# Patient Record
Sex: Male | Born: 1965 | Race: Black or African American | Hispanic: No | Marital: Single | Smoking: Never smoker
Health system: Southern US, Community
[De-identification: ages and names within clinical notes are randomized; demographics above are authoritative.]

## PROBLEM LIST (undated history)

## (undated) DIAGNOSIS — L0390 Cellulitis, unspecified: Secondary | ICD-10-CM

## (undated) HISTORY — PX: OTHER SURGICAL HISTORY: SHX169

---

## 1998-08-03 ENCOUNTER — Ambulatory Visit: Admission: RE | Admit: 1998-08-03 | Discharge: 1998-08-03 | Payer: Self-pay | Admitting: *Deleted

## 2001-08-26 ENCOUNTER — Emergency Department (HOSPITAL_COMMUNITY): Admission: EM | Admit: 2001-08-26 | Discharge: 2001-08-27 | Payer: Self-pay | Admitting: Emergency Medicine

## 2001-12-03 ENCOUNTER — Emergency Department (HOSPITAL_COMMUNITY): Admission: EM | Admit: 2001-12-03 | Discharge: 2001-12-03 | Payer: Self-pay

## 2001-12-10 ENCOUNTER — Ambulatory Visit (HOSPITAL_BASED_OUTPATIENT_CLINIC_OR_DEPARTMENT_OTHER): Admission: RE | Admit: 2001-12-10 | Discharge: 2001-12-10 | Payer: Self-pay | Admitting: Urology

## 2003-10-16 ENCOUNTER — Encounter: Admission: RE | Admit: 2003-10-16 | Discharge: 2003-10-16 | Payer: Self-pay

## 2004-09-22 HISTORY — PX: SHOULDER SURGERY: SHX246

## 2008-02-26 ENCOUNTER — Emergency Department (HOSPITAL_BASED_OUTPATIENT_CLINIC_OR_DEPARTMENT_OTHER): Admission: EM | Admit: 2008-02-26 | Discharge: 2008-02-27 | Payer: Self-pay | Admitting: Emergency Medicine

## 2008-02-28 ENCOUNTER — Emergency Department (HOSPITAL_BASED_OUTPATIENT_CLINIC_OR_DEPARTMENT_OTHER): Admission: EM | Admit: 2008-02-28 | Discharge: 2008-02-28 | Payer: Self-pay | Admitting: Emergency Medicine

## 2008-03-04 ENCOUNTER — Emergency Department (HOSPITAL_BASED_OUTPATIENT_CLINIC_OR_DEPARTMENT_OTHER): Admission: EM | Admit: 2008-03-04 | Discharge: 2008-03-04 | Payer: Self-pay | Admitting: Emergency Medicine

## 2008-05-10 ENCOUNTER — Emergency Department (HOSPITAL_COMMUNITY): Admission: EM | Admit: 2008-05-10 | Discharge: 2008-05-10 | Payer: Self-pay | Admitting: Family Medicine

## 2010-10-13 ENCOUNTER — Encounter: Payer: Self-pay | Admitting: Sports Medicine

## 2014-06-06 ENCOUNTER — Emergency Department (HOSPITAL_COMMUNITY)
Admission: EM | Admit: 2014-06-06 | Discharge: 2014-06-06 | Discharge status: Against Medical Advice | Payer: Self-pay | Attending: Emergency Medicine | Admitting: Emergency Medicine

## 2014-06-06 ENCOUNTER — Encounter (HOSPITAL_COMMUNITY): Payer: Self-pay | Admitting: Emergency Medicine

## 2014-06-06 ENCOUNTER — Emergency Department (HOSPITAL_COMMUNITY)
Admission: EM | Admit: 2014-06-06 | Discharge: 2014-06-07 | Disposition: A | Discharge status: Home / Self Care | Payer: Self-pay | Attending: Emergency Medicine | Admitting: Emergency Medicine

## 2014-06-06 DIAGNOSIS — M79605 Pain in left leg: Secondary | ICD-10-CM

## 2014-06-06 DIAGNOSIS — Z5320 Procedure and treatment not carried out because of patient's decision for unspecified reasons: Secondary | ICD-10-CM | POA: Insufficient documentation

## 2014-06-06 DIAGNOSIS — R509 Fever, unspecified: Secondary | ICD-10-CM | POA: Insufficient documentation

## 2014-06-06 DIAGNOSIS — Z79899 Other long term (current) drug therapy: Secondary | ICD-10-CM | POA: Insufficient documentation

## 2014-06-06 DIAGNOSIS — D72829 Elevated white blood cell count, unspecified: Secondary | ICD-10-CM | POA: Insufficient documentation

## 2014-06-06 DIAGNOSIS — L03119 Cellulitis of unspecified part of limb: Principal | ICD-10-CM

## 2014-06-06 DIAGNOSIS — M79609 Pain in unspecified limb: Secondary | ICD-10-CM | POA: Insufficient documentation

## 2014-06-06 DIAGNOSIS — M7989 Other specified soft tissue disorders: Secondary | ICD-10-CM | POA: Insufficient documentation

## 2014-06-06 DIAGNOSIS — L03116 Cellulitis of left lower limb: Secondary | ICD-10-CM

## 2014-06-06 DIAGNOSIS — L02419 Cutaneous abscess of limb, unspecified: Secondary | ICD-10-CM | POA: Insufficient documentation

## 2014-06-06 NOTE — ED Notes (Signed)
PT states that he had a fever, chills and general malaise over the weekend; pt states that he woke up Monday morning with redness and swelling to left lower leg; pt states that he was seen at the urgent care yesterday and was prescribed Potassium and advised if redness and swelling persisted to follow up with ER; pt states that the redness and swelling has gotten worse.

## 2014-06-06 NOTE — ED Notes (Signed)
Pt called twice for triage no answer in lobby.

## 2014-06-07 LAB — BASIC METABOLIC PANEL
ANION GAP: 17 — AB (ref 5–15)
BUN: 16 mg/dL (ref 6–23)
CHLORIDE: 100 meq/L (ref 96–112)
CO2: 23 meq/L (ref 19–32)
Calcium: 10.1 mg/dL (ref 8.4–10.5)
Creatinine, Ser: 0.92 mg/dL (ref 0.50–1.35)
GFR calc non Af Amer: >90 mL/min (ref >90)
Glucose, Bld: 70 mg/dL (ref 70–99)
POTASSIUM: 4.1 meq/L (ref 3.7–5.3)
SODIUM: 140 meq/L (ref 137–147)

## 2014-06-07 LAB — CBC WITH DIFFERENTIAL/PLATELET
BASOS ABS: 0 10*3/uL (ref 0.0–0.1)
Basophils Relative: 0 % (ref 0–1)
Eosinophils Absolute: 0.3 10*3/uL (ref 0.0–0.7)
Eosinophils Relative: 2 % (ref 0–5)
HEMATOCRIT: 40.5 % (ref 39.0–52.0)
Hemoglobin: 14.1 g/dL (ref 13.0–17.0)
LYMPHS PCT: 10 % — AB (ref 12–46)
Lymphs Abs: 1.8 10*3/uL (ref 0.7–4.0)
MCH: 30.3 pg (ref 26.0–34.0)
MCHC: 34.8 g/dL (ref 30.0–36.0)
MCV: 87.1 fL (ref 78.0–100.0)
MONO ABS: 1.4 10*3/uL — AB (ref 0.1–1.0)
Monocytes Relative: 8 % (ref 3–12)
NEUTROS ABS: 14.1 10*3/uL — AB (ref 1.7–7.7)
NEUTROS PCT: 80 % — AB (ref 43–77)
PLATELETS: 224 10*3/uL (ref 150–400)
RBC: 4.65 MIL/uL (ref 4.22–5.81)
RDW: 13.7 % (ref 11.5–15.5)
WBC: 17.6 10*3/uL — AB (ref 4.0–10.5)

## 2014-06-07 MED ORDER — OXYCODONE-ACETAMINOPHEN 5-325 MG PO TABS: 1.0000 | ORAL_TABLET | Freq: Four times a day (QID) | ORAL | Status: DC | PRN

## 2014-06-07 MED ORDER — NAPROXEN 500 MG PO TABS: 500.0000 mg | ORAL_TABLET | Freq: Two times a day (BID) | ORAL | Status: DC | PRN

## 2014-06-07 MED ORDER — CEPHALEXIN 500 MG PO CAPS: ORAL_CAPSULE | ORAL | Status: DC

## 2014-06-07 MED ORDER — SULFAMETHOXAZOLE-TMP DS 800-160 MG PO TABS: 1.0000 | ORAL_TABLET | Freq: Two times a day (BID) | ORAL | Status: DC

## 2014-06-07 MED ORDER — MORPHINE SULFATE 4 MG/ML IJ SOLN: 4.0000 mg | Freq: Once | INTRAMUSCULAR | Status: DC

## 2014-06-07 MED ORDER — SODIUM CHLORIDE 0.9 % IV BOLUS (SEPSIS)
1000.0000 mL | Freq: Once | INTRAVENOUS | Status: AC
  Administered 2014-06-07: 1000 mL via INTRAVENOUS

## 2014-06-07 MED ORDER — PIPERACILLIN-TAZOBACTAM 3.375 G IVPB 30 MIN
3.3750 g | Freq: Once | INTRAVENOUS | Status: AC
  Administered 2014-06-07: 3.375 g via INTRAVENOUS
  Filled 2014-06-07: qty 50

## 2014-06-07 MED ORDER — LORAZEPAM 1 MG PO TABS
1.0000 mg | ORAL_TABLET | Freq: Once | ORAL | Status: AC
  Administered 2014-06-07: 1 mg via ORAL
  Filled 2014-06-07: qty 1

## 2014-06-07 MED ORDER — MORPHINE SULFATE 4 MG/ML IJ SOLN
4.0000 mg | Freq: Once | INTRAMUSCULAR | Status: AC
  Administered 2014-06-07: 4 mg via INTRAVENOUS
  Filled 2014-06-07: qty 1

## 2014-06-07 NOTE — ED Notes (Signed)
Pt given Morphine  pt informed he needs to find a ride or stay until 0541. Pt states he will stay. MD aware.

## 2014-06-07 NOTE — ED Notes (Signed)
Morphine infiltrated in Pt IV. Pt c/o severe burning at IV site. Given ice pack.

## 2014-06-07 NOTE — ED Provider Notes (Signed)
CSN: [id number]     Arrival date & time 06/06/14  2000 History   First MD Initiated Contact with Patient 06/06/14 2259     Chief Complaint  Patient presents with  . Cellulitis     (Consider location/radiation/quality/duration/timing/severity/associated sxs/prior Treatment) HPI Comments: Edward Zuniga is a 48 y.o. male with no significant PMHx who presents to the ED with 3 days of LLE pain/swelling/redness which is worsening. Pt states that on Saturday he went to urgent care for fever of 101.7 and some mild n/v/d, given tylenol and phenergan with relief, and sent home. At that time he states he had no rashes or leg symptoms. Sunday he felt better, but by Monday his L lower leg began to swell, became red and hot and very tender, although his fever had resolved. He went back to urgent care who advised him to start taking potassium tabs and return for any worsening of redness/swelling. Tonight he states the redness, swelling, pain, and warmth has begun to spread into the area above his knee and up towards his groin. Pain is 10/10, tightness and burning, nonradiating, constant, worse with activity/standing/walking, and improved with elevation and rest. Reports that he's felt febrile and had chills throughout the day today which prompted him to come to the ED. 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 Denies ongoing fever at this time, CP, SOB, cough, n/v/d/c, dysuria, hematuria, hemoptysis, PND, orthopnea, weakness, numbness, paresthesias, hx of DVT/PE, FHx of DVT/PE, recent travel or immobilization, recent surgeries, or any known tick bites. Denies red streaking or weeping of the leg.   Patient is a 48 y.o. male presenting with leg pain. The history is provided by the patient. No language interpreter was used.  Leg Pain Location:  Leg Time since incident:  3 days Injury: no   Leg location:  L lower leg Pain details:    Quality:  Pressure and burning   Radiates to:   Does not radiate   Severity:  Severe (10/10)   Onset quality:  Gradual   Duration:  3 days   Timing:  Constant   Progression:  Worsening Chronicity:  New Prior injury to area:  No Relieved by:  Elevation and rest Worsened by:  Activity Ineffective treatments:  None tried Associated symptoms: fever (101.7 saturday, intermittent since) and swelling   Associated symptoms: no back pain, no decreased ROM, no itching, no muscle weakness, no numbness and no tingling   Risk factors: obesity     History reviewed. No pertinent past medical history. History reviewed. No pertinent past surgical history. No family history on file. History  Substance Use Topics  . Smoking status: Never Smoker   . Smokeless tobacco: Not on file  . Alcohol Use: No     Comment: occ    Review of Systems  Constitutional: Positive for fever (101.7 saturday, intermittent since) and chills. Negative for diaphoresis.  HENT: Negative for congestion, sinus pressure and sore throat.   Respiratory: Negative for cough and shortness of breath.   Cardiovascular: Positive for leg swelling. Negative for chest pain and palpitations.  Gastrointestinal: Negative for nausea, vomiting, abdominal pain, diarrhea and abdominal distention.  Genitourinary: Negative for dysuria, urgency, frequency, hematuria, flank pain, discharge, penile swelling, scrotal swelling, penile pain and testicular pain.  Musculoskeletal: Positive for myalgias (LLE pain). Negative for arthralgias, back pain and joint swelling.  Skin: Positive for color change. Negative for itching.  Allergic/Immunologic: Negative for immunocompromised state.  Neurological: Negative for dizziness, weakness,  light-headedness, numbness and headaches.  Hematological: Negative for adenopathy.  10 Systems reviewed and are negative for acute change except as noted in the HPI.     Allergies  Review of patient's allergies indicates no known allergies.  Home Medications    Prior to Admission medications   Medication Sig Start Date End Date Taking? Authorizing Provider  ibuprofen (ADVIL,MOTRIN) 800 MG tablet Take 800 mg by mouth 2 (two) times daily as needed for moderate pain.   Yes Historical Provider, MD  Multiple Vitamin (MULTIVITAMIN WITH MINERALS) TABS tablet Take 1 tablet by mouth daily.   Yes Historical Provider, MD  potassium chloride SA (K-DUR,KLOR-CON) 20 MEQ tablet Take 20 mEq by mouth daily.   Yes Historical Provider, MD  promethazine (PHENERGAN) 25 MG tablet Take 25 mg by mouth every 6 (six) hours as needed for nausea or vomiting.   Yes Historical Provider, MD  cephALEXin (KEFLEX) 500 MG capsule 2 caps po bid x 7 days 06/07/14   Donnita Falls Camprubi-Soms, PA-C  naproxen (NAPROSYN) 500 MG tablet Take 1 tablet (500 mg total) by mouth 2 (two) times daily as needed for mild pain, moderate pain or headache (TAKE WITH MEALS.). 06/07/14   Bryce Cheever Strupp Camprubi-Soms, PA-C  oxyCODONE-acetaminophen (PERCOCET) 5-325 MG per tablet Take 1 tablet by mouth every 6 (six) hours as needed for severe pain. 06/07/14   Denese Mentink Strupp Camprubi-Soms, PA-C  sulfamethoxazole-trimethoprim (BACTRIM DS) 800-160 MG per tablet Take 1 tablet by mouth 2 (two) times daily. 06/07/14   Keelyn Monjaras Strupp Camprubi-Soms, PA-C   BP 137/79  Pulse 100  Temp(Src) 98 F (36.7 C) (Oral)  Resp 18  Ht 6' (1.829 m)  Wt 280 lb (127.007 kg)  BMI 37.97 kg/m2  SpO2 99% Physical Exam  Nursing note and vitals reviewed. Constitutional: He is oriented to person, place, and time. Vital signs are normal. He appears well-developed and well-nourished.  Non-toxic appearance. No distress.  Afebrile, nontoxic, NAD  HENT:  Head: Normocephalic and atraumatic.  Mouth/Throat: Oropharynx is clear and moist and mucous membranes are normal.  Eyes: Conjunctivae and EOM are normal. Right eye exhibits no discharge. Left eye exhibits no discharge.  Neck: Normal range of motion. Neck supple.  Cardiovascular:  Normal rate, regular rhythm, normal heart sounds and intact distal pulses.  Exam reveals no gallop and no friction rub.   No murmur heard. Distal pulses intact, good cap refill in all digits  Pulmonary/Chest: Effort normal and breath sounds normal. No respiratory distress. He has no decreased breath sounds. He has no wheezes. He has no rhonchi. He has no rales.  Abdominal: Soft. Normal appearance and bowel sounds are normal. He exhibits no distension. There is no tenderness. There is no rigidity, no rebound and no guarding.  Musculoskeletal: Normal range of motion.       Left lower leg: He exhibits tenderness and swelling.  LLE with 2+ edema, erythema, warmth, and tenderness extending from ankle to knee and some erythema over medial thigh up into inguinal area. Neg Homan's. No superficial veins noted. No popliteal masses or TTP. Strength 5/5 in all extremities, sensation grossly intact, distal pulses intact. FROM intact in LLE   Lymphadenopathy:       Left: Inguinal adenopathy present.  L inguinal LAD which is nontender  Neurological: He is alert and oriented to person, place, and time. He has normal strength. No sensory deficit.  Skin: Skin is warm and dry. There is erythema.  LLE with swelling, erythema, warmth, and TTP over entire lower leg  from ankle to knee, and redness extending medially into thigh and inguinal area. 2+ edema. No obvious skin injury although multiple insect bites noted to ankle. No weeping or abscessed areas.  Psychiatric: He has a normal mood and affect.          ED Course  Procedures (including critical care time) Labs Review Labs Reviewed  CBC WITH DIFFERENTIAL - Abnormal; Notable for the following:    WBC 17.6 (*)    Neutrophils Relative % 80 (*)    Neutro Abs 14.1 (*)    Lymphocytes Relative 10 (*)    Monocytes Absolute 1.4 (*)    All other components within normal limits  BASIC METABOLIC PANEL - Abnormal; Notable for the following:    Anion gap 17 (*)     All other components within normal limits    Imaging Review No results found.   EKG Interpretation None      MDM   Final diagnoses:  Left leg cellulitis  Leukocytosis  Left leg swelling  Left leg pain     48y/o male with LLE cellulitis vs DVT. Given lack of risk factors for DVT, this is likely cellulitis, especially since pt endorses documented fever x3 days. Will obtain basic labs in order to have baseline, and give dose of IV zosyn here. Pt VSS and afebrile at the moment, doubt need for admission, especially since he has no comorbidities and has not been trialed on PO outpt therapy. Will give morphine now for pain. Will give 1 dose of zosyn prior to d/c and plan for d/c on bactrim/keflex  1:45 AM CBC w/diff showing WBC 17.6, BMP WNL. Nursing stating they lost IV access, and pt is severely afraid of needles. Will give ativan PO and replace IV.  2:30 AM Repeat VS showing temp 99.5, and HR now 108, will give fluids while zosyn is given.  2:51 AM Pt continuing to have pain, will redose morphine . Fluids and zosyn running, will discharge after finished. Discussed having him return in 2 days to have leg rechecked, and given strict return precautions especially with regards to possibility of DVT and that if leg is not improving in 2 days he may need an ultrasound to eval for clot. Highly doubt that he needs emergent U/S or empiric anticoagulation at this time. Will give naprosyn/percocet for pain, and bactrim/keflex for infection. Resource guide given. I explained the diagnosis and have given explicit precautions to return to the ER including for any other new or worsening symptoms. The patient understands and accepts the medical plan as it's been dictated and I have answered their questions. Discharge instructions concerning home care and prescriptions have been given. The patient is STABLE and is discharged to home in good condition.  BP 151/85  Pulse 108  Temp(Src) 99.5 F (37.5  C) (Oral)  Resp 14  Ht 6' (1.829 m)  Wt 280 lb (127.007 kg)  BMI 37.97 kg/m2  SpO2 98%  Meds ordered this encounter  Medications  . morphine 4 MG/ML injection 4 mg    Sig:   . piperacillin-tazobactam (ZOSYN) IVPB 3.375 g    Sig:     Order Specific Question:  Antibiotic Indication:    Answer:  Cellulitis  . LORazepam (ATIVAN) tablet 1 mg    Sig:   . sodium chloride 0.9 % bolus 1,000 mL    Sig:   . morphine 4 MG/ML injection 4 mg    Sig:   . oxyCODONE-acetaminophen (PERCOCET) 5-325 MG per tablet  Sig: Take 1 tablet by mouth every 6 (six) hours as needed for severe pain.    Dispense:  10 tablet    Refill:  0    Order Specific Question:  Supervising Provider    Answer:  Eber Hong D [[id number]]  . naproxen (NAPROSYN) 500 MG tablet    Sig: Take 1 tablet (500 mg total) by mouth 2 (two) times daily as needed for mild pain, moderate pain or headache (TAKE WITH MEALS.).    Dispense:  20 tablet    Refill:  0    Order Specific Question:  Supervising Provider    Answer:  Eber Hong D [[id number]]  . sulfamethoxazole-trimethoprim (BACTRIM DS) 800-160 MG per tablet    Sig: Take 1 tablet by mouth 2 (two) times daily.    Dispense:  14 tablet    Refill:  0    Order Specific Question:  Supervising Provider    Answer:  Eber Hong D [[id number]]  . cephALEXin (KEFLEX) 500 MG capsule    Sig: 2 caps po bid x 7 days    Dispense:  28 capsule    Refill:  0    Order Specific Question:  Supervising Provider    Answer:  Eber Hong D [[id number]]     Donnita Falls Camprubi-Soms, PA-C 06/07/14 0505

## 2014-06-07 NOTE — Discharge Instructions (Signed)
Keep your leg clean and dry. Take antibiotics until it is finished. Take naprosyn and percocet as directed, as needed for pain but do not drive or operate machinery with pain medication use. Use tylenol or motrin for fever, unless you've been using the naprosyn and percocet which will also help with fever. Followup with [facility]Primary Care doctor in 2 days for wound recheck.  Return to emergency department for emergent changing or worsening symptoms such as increasing fever resistant to tylenol/motrin, worsening redness or swelling or pain, chest pain or shortness of breath, or any other new symptoms.   Cellulitis Cellulitis is an infection of the skin and the tissue under the skin. The infected area is usually red and tender. This happens most often in the arms and lower legs. HOME CARE   Take your antibiotic medicine as told. Finish the medicine even if you start to feel better.  Keep the infected arm or leg raised (elevated).  Put a warm cloth on the area up to 4 times per day.  Only take medicines as told by your doctor.  Keep all doctor visits as told. GET HELP IF:  You see red streaks on the skin coming from the infected area.  Your red area gets bigger or turns a dark color.  Your bone or joint under the infected area is painful after the skin heals.  Your infection comes back in the same area or different area.  You have a puffy (swollen) bump in the infected area.  You have new symptoms.  You have a fever. GET HELP RIGHT AWAY IF:   You feel very sleepy.  You throw up (vomit) or have watery poop (diarrhea).  You feel sick and have muscle aches and pains. MAKE SURE YOU:   Understand these instructions.  Will watch your condition.  Will get help right away if you are not doing well or get worse. Document Released: 02/25/2008 Document Revised: 01/23/2014 Document Reviewed: 11/24/2011 [facility] Patient Information 2015  [place], [place]. This information is not intended to replace advice given to you by your health care provider. Make sure you discuss any questions you have with your health care provider.  Emergency Department Resource Guide 1) Find a Doctor and Pay Out of Pocket Although you won't have to find out who is covered by your insurance plan, it is a good idea to ask around and get recommendations. You will then need to call the office and see if the doctor you have chosen will accept you as a new patient and what types of options they offer for patients who are self-pay. Some doctors offer discounts or will set up payment plans for their patients who do not have insurance, but you will need to ask so you aren't surprised when you get to your appointment.  2) Contact Your Local Health Department Not all health departments have doctors that can see patients for sick visits, but many do, so it is worth a call to see if yours does. If you don't know where your local health department is, you can check in your phone book. The CDC also has a tool to help you locate your state's health department, and many state websites also have listings of all of their local health departments.  3) Find a Walk-in Clinic If your illness is not likely to be very severe or complicated, you may want to try a walk in clinic. These are popping up all over the country in  pharmacies, drugstores, and shopping centers. They're usually staffed by nurse practitioners or physician assistants that have been trained to treat common illnesses and complaints. They're usually fairly quick and inexpensive. However, if you have serious medical issues or chronic medical problems, these are probably not your best option.  No Primary Care Doctor: - Call [facility] Connect at  [phone] - they can help you locate a primary care doctor that  accepts your insurance, provides certain services, etc. - Physician Referral Service- [phone]  Chronic Pain  Problems: Organization         Address  Phone   Notes  [facility]  [phone] Patients need to be referred by their primary care doctor.   Medication Assistance: Organization         Address  Phone   Notes  [facility] Medication [facility] [address] [place] [place], [place] [address] [phone] --Must be a resident of [facility] -- Must have NO insurance coverage whatsoever (no Medicaid/ Medicare, etc.) -- The pt. MUST have a primary care doctor that directs their care regularly and follows them in the community   [facility]  [phone]   [employer]  [phone]    Agencies that provide inexpensive medical care: Organization         Address  Phone   Notes  [facility]  [phone]   [facility]    [phone]   [facility] [address] [place], [place] [address] [phone]   [facility] [address] [place], [place] [phone]   [facility]    [phone]   [facility]    [phone]   [facility]  [address], [place] Phone:  [phone], Fax:  [phone] Hours of Operation:  9 am - 6 pm, M-F.  Also accepts Medicaid/Medicare and self-pay.  [facility]  [address], [place] Phone: [phone], Fax: [phone]. Hours of Operation:  8:30 am - 5:30 pm, M-F.  Also accepts Medicaid and self-pay.  [facility] [place] [address] [place] Phone: [phone]   [facility] [address] Natasha Bence [place], [place] [phone], Ext. [phone] Mondays & Thursdays: 7-9 AM.  First 15 patients are seen on a first come, first serve basis.    Medicaid-accepting [facility] Providers:  Organization         Address  Phone   Notes  [facility] [address], [place] [phone] Also  accepts self-pay patients.  [facility] [address] Laurell Josephs [place], [place]  [phone]   [facility] [address], [place] [phone]   [facility] [address] [place] [phone]   Renaye Rakers [address], [place]   [phone] Only accepts [place] patients after they have their name applied to their card.   Self-Pay (no insurance) in [facility]:  Organization         Address  Phone   Notes  Sickle Cell Patients, [facility] [address] [place], [place] [phone]   [facility] [address] [place], [place] [phone]   [facility] [place]  [address],  [place] [phone]   [facility]/Dr. Osei-Bonsu  [address] [place] or [address], [place] [phone] Phone number for both [place] and [place] locations is the same.  [facility] [address] [place] [phone]   [facility] [address], [place] or [address] [phone]   [facility] [address] [place] [phone], phone; [phone], fax Sees patients 1st and 3rd Saturday of every month.  Must not qualify for public or private insurance (i.e. Medicaid, Medicare, [place] Health Choice, Veterans' Benefits)  Household income should be no more than 200% of the poverty level The clinic cannot treat you if you are pregnant or think you are pregnant  Sexually transmitted diseases are not treated at the clinic.

## 2014-06-07 NOTE — ED Notes (Signed)
Pt medications held until PO Ativan given. Pt IV infiltrated and states before staff starts another he would like something to help him calm down.

## 2014-06-08 ENCOUNTER — Emergency Department (HOSPITAL_COMMUNITY): Admission: EM | Admit: 2014-06-08 | Discharge: 2014-06-08 | Discharge status: Against Medical Advice | Payer: Self-pay

## 2014-06-08 NOTE — ED Provider Notes (Signed)
Medical screening examination/treatment/procedure(s) were performed by non-physician practitioner and as supervising physician I was immediately available for consultation/collaboration.   EKG Interpretation None        Richardean Canal, MD 06/08/14 2118

## 2014-06-09 ENCOUNTER — Inpatient Hospital Stay (HOSPITAL_COMMUNITY)
Admission: EM | Admit: 2014-06-09 | Discharge: 2014-06-11 | DRG: 603 | Disposition: A | Discharge status: Home / Self Care | Payer: Self-pay | Attending: Internal Medicine | Admitting: Internal Medicine

## 2014-06-09 ENCOUNTER — Encounter (HOSPITAL_COMMUNITY): Payer: Self-pay | Admitting: Emergency Medicine

## 2014-06-09 DIAGNOSIS — L02419 Cutaneous abscess of limb, unspecified: Principal | ICD-10-CM | POA: Diagnosis present

## 2014-06-09 DIAGNOSIS — D72829 Elevated white blood cell count, unspecified: Secondary | ICD-10-CM | POA: Diagnosis present

## 2014-06-09 DIAGNOSIS — L03119 Cellulitis of unspecified part of limb: Principal | ICD-10-CM

## 2014-06-09 DIAGNOSIS — L0390 Cellulitis, unspecified: Secondary | ICD-10-CM | POA: Insufficient documentation

## 2014-06-09 DIAGNOSIS — Z23 Encounter for immunization: Secondary | ICD-10-CM

## 2014-06-09 DIAGNOSIS — L03116 Cellulitis of left lower limb: Secondary | ICD-10-CM

## 2014-06-09 DIAGNOSIS — M79609 Pain in unspecified limb: Secondary | ICD-10-CM

## 2014-06-09 HISTORY — DX: Cellulitis, unspecified: L03.90

## 2014-06-09 LAB — CBC WITH DIFFERENTIAL/PLATELET
BASOS ABS: 0 10*3/uL (ref 0.0–0.1)
Basophils Relative: 0 % (ref 0–1)
Eosinophils Absolute: 0.4 10*3/uL (ref 0.0–0.7)
Eosinophils Relative: 2 % (ref 0–5)
HEMATOCRIT: 38.5 % — AB (ref 39.0–52.0)
HEMOGLOBIN: 13.1 g/dL (ref 13.0–17.0)
LYMPHS PCT: 13 % (ref 12–46)
Lymphs Abs: 2.2 10*3/uL (ref 0.7–4.0)
MCH: 30.3 pg (ref 26.0–34.0)
MCHC: 34 g/dL (ref 30.0–36.0)
MCV: 89.1 fL (ref 78.0–100.0)
MONO ABS: 1.1 10*3/uL — AB (ref 0.1–1.0)
MONOS PCT: 7 % (ref 3–12)
NEUTROS ABS: 12.7 10*3/uL — AB (ref 1.7–7.7)
NEUTROS PCT: 78 % — AB (ref 43–77)
Platelets: 249 10*3/uL (ref 150–400)
RBC: 4.32 MIL/uL (ref 4.22–5.81)
RDW: 13.9 % (ref 11.5–15.5)
WBC: 16.3 10*3/uL — AB (ref 4.0–10.5)

## 2014-06-09 LAB — COMPREHENSIVE METABOLIC PANEL
ALBUMIN: 3.1 g/dL — AB (ref 3.5–5.2)
ALK PHOS: 123 U/L — AB (ref 39–117)
ALT: 35 U/L (ref 0–53)
AST: 20 U/L (ref 0–37)
Anion gap: 14 (ref 5–15)
BUN: 13 mg/dL (ref 6–23)
CHLORIDE: 101 meq/L (ref 96–112)
CO2: 24 meq/L (ref 19–32)
CREATININE: 0.85 mg/dL (ref 0.50–1.35)
Calcium: 9.6 mg/dL (ref 8.4–10.5)
GFR calc Af Amer: >90 mL/min (ref >90)
Glucose, Bld: 88 mg/dL (ref 70–99)
POTASSIUM: 4.1 meq/L (ref 3.7–5.3)
Sodium: 139 mEq/L (ref 137–147)
Total Protein: 7.9 g/dL (ref 6.0–8.3)

## 2014-06-09 MED ORDER — INFLUENZA VAC SPLIT QUAD 0.5 ML IM SUSY
0.5000 mL | PREFILLED_SYRINGE | INTRAMUSCULAR | Status: AC
  Administered 2014-06-10: 0.5 mL via INTRAMUSCULAR
  Filled 2014-06-09 (×2): qty 0.5

## 2014-06-09 MED ORDER — VANCOMYCIN HCL 10 G IV SOLR
1500.0000 mg | Freq: Once | INTRAVENOUS | Status: AC
  Administered 2014-06-09: 1500 mg via INTRAVENOUS
  Filled 2014-06-09: qty 1500

## 2014-06-09 MED ORDER — ONDANSETRON HCL 4 MG/2ML IJ SOLN: 4.0000 mg | Freq: Four times a day (QID) | INTRAMUSCULAR | Status: DC | PRN

## 2014-06-09 MED ORDER — ALUM & MAG HYDROXIDE-SIMETH 200-200-20 MG/5ML PO SUSP: 30.0000 mL | Freq: Four times a day (QID) | ORAL | Status: DC | PRN

## 2014-06-09 MED ORDER — ACETAMINOPHEN 325 MG PO TABS
650.0000 mg | ORAL_TABLET | Freq: Four times a day (QID) | ORAL | Status: DC | PRN
  Administered 2014-06-11: 650 mg via ORAL
  Filled 2014-06-09: qty 2

## 2014-06-09 MED ORDER — ENOXAPARIN SODIUM 60 MG/0.6ML ~~LOC~~ SOLN
60.0000 mg | SUBCUTANEOUS | Status: DC
  Administered 2014-06-09 – 2014-06-10 (×2): 60 mg via SUBCUTANEOUS
  Filled 2014-06-09 (×3): qty 0.6

## 2014-06-09 MED ORDER — OXYCODONE HCL 5 MG PO TABS
5.0000 mg | ORAL_TABLET | ORAL | Status: DC | PRN
  Filled 2014-06-09: qty 1

## 2014-06-09 MED ORDER — HYDROMORPHONE HCL 1 MG/ML IJ SOLN
0.5000 mg | INTRAMUSCULAR | Status: DC | PRN
  Administered 2014-06-09 – 2014-06-11 (×9): 1 mg via INTRAVENOUS
  Filled 2014-06-09 (×10): qty 1

## 2014-06-09 MED ORDER — ONDANSETRON HCL 4 MG PO TABS: 4.0000 mg | ORAL_TABLET | Freq: Four times a day (QID) | ORAL | Status: DC | PRN

## 2014-06-09 MED ORDER — VANCOMYCIN HCL IN DEXTROSE 1-5 GM/200ML-% IV SOLN
1000.0000 mg | Freq: Once | INTRAVENOUS | Status: AC
  Administered 2014-06-09: 1000 mg via INTRAVENOUS
  Filled 2014-06-09: qty 200

## 2014-06-09 MED ORDER — SODIUM CHLORIDE 0.9 % IV SOLN
INTRAVENOUS | Status: DC
  Administered 2014-06-09: 20 mL/h via INTRAVENOUS

## 2014-06-09 MED ORDER — VANCOMYCIN HCL 10 G IV SOLR
1250.0000 mg | Freq: Two times a day (BID) | INTRAVENOUS | Status: DC
  Administered 2014-06-10 – 2014-06-11 (×3): 1250 mg via INTRAVENOUS
  Filled 2014-06-09 (×4): qty 1250

## 2014-06-09 MED ORDER — SODIUM CHLORIDE 0.9 % IV SOLN
INTRAVENOUS | Status: DC
  Administered 2014-06-09 – 2014-06-11 (×3): via INTRAVENOUS

## 2014-06-09 MED ORDER — ACETAMINOPHEN 650 MG RE SUPP: 650.0000 mg | Freq: Four times a day (QID) | RECTAL | Status: DC | PRN

## 2014-06-09 NOTE — ED Provider Notes (Signed)
CSN: [id number]     Arrival date & time 06/09/14  1323 History   None    Chief Complaint  Patient presents with  . Recurrent Skin Infections  . Leg Pain     (Consider location/radiation/quality/duration/timing/severity/associated sxs/prior Treatment) HPI MEL LANGAN is a 48 y.o. male who is here for reevaluation of his LLE cellulitis. He was seen here Tuesday evening and discharged with Bactrim for cellulitis. He was told to return on Friday for reevaluation. He states since Tuesday he has not experienced any fevers, his welps are gone, decreased tenderness in his leg, decreased swelling and redness, decreased pain, decreased lymphadenopathy. He denies headache, shortness of breath, chest pain, abdominal pain, numbness or weakness. He states he feels much better since starting the antibiotic.  Past Medical History  Diagnosis Date  . Cellulitis    Past Surgical History  Procedure Laterality Date  . Shoulder surgery     No family history on file. History  Substance Use Topics  . Smoking status: Never Smoker   . Smokeless tobacco: Not on file  . Alcohol Use: No     Comment: occ    Review of Systems  Constitutional: Negative for fever.  HENT: Negative for sore throat.   Eyes: Negative for visual disturbance.  Respiratory: Negative for shortness of breath.   Cardiovascular: Negative for chest pain.  Gastrointestinal: Negative for abdominal pain.  Endocrine: Negative for polyuria.  Genitourinary: Negative for dysuria.  Musculoskeletal: Positive for myalgias.  Skin: Positive for color change. Negative for rash.  Neurological: Negative for dizziness.  Hematological: Does not bruise/bleed easily.      Allergies  Review of patient's allergies indicates no known allergies.  Home Medications   Prior to Admission medications   Medication Sig Start Date End Date Taking? Authorizing Provider  cephALEXin (KEFLEX) 500 MG capsule 2 caps po bid x 7 days 06/07/14  Yes Mercedes  Strupp Camprubi-Soms, PA-C  ibuprofen (ADVIL,MOTRIN) 800 MG tablet Take 800 mg by mouth 2 (two) times daily as needed for moderate pain.   Yes Historical Provider, MD  Multiple Vitamin (MULTIVITAMIN WITH MINERALS) TABS tablet Take 1 tablet by mouth daily.   Yes Historical Provider, MD  naproxen (NAPROSYN) 500 MG tablet Take 1 tablet (500 mg total) by mouth 2 (two) times daily as needed for mild pain, moderate pain or headache (TAKE WITH MEALS.). 06/07/14  Yes Mercedes Strupp Camprubi-Soms, PA-C  oxyCODONE-acetaminophen (PERCOCET) 5-325 MG per tablet Take 1 tablet by mouth every 6 (six) hours as needed for severe pain. 06/07/14  Yes Mercedes Strupp Camprubi-Soms, PA-C  promethazine (PHENERGAN) 25 MG tablet Take 25 mg by mouth every 6 (six) hours as needed for nausea or vomiting.   Yes Historical Provider, MD  sulfamethoxazole-trimethoprim (BACTRIM DS) 800-160 MG per tablet Take 1 tablet by mouth 2 (two) times daily. 06/07/14  Yes Mercedes Strupp Camprubi-Soms, PA-C  potassium chloride SA (K-DUR,KLOR-CON) 20 MEQ tablet Take 20 mEq by mouth daily.    Historical Provider, MD   BP 137/82  Pulse 71  Temp(Src) 97.8 F (36.6 C) (Oral)  Resp 16  Ht 6' (1.829 m)  Wt 280 lb (127.007 kg)  BMI 37.97 kg/m2  SpO2 100% Physical Exam  Nursing note and vitals reviewed. Constitutional:  Awake, alert, nontoxic appearance with baseline speech for patient.  HENT:  Head: Atraumatic.  Mouth/Throat: No oropharyngeal exudate.  Eyes: EOM are normal. Pupils are equal, round, and reactive to light. Right eye exhibits no discharge. Left eye exhibits no discharge.  Neck: Neck supple.  Cardiovascular: Normal rate and regular rhythm.   No murmur heard. Pulmonary/Chest: Effort normal and breath sounds normal. No stridor. No respiratory distress. He has no wheezes. He has no rales. He exhibits no tenderness.  Abdominal: Soft. Bowel sounds are normal. He exhibits no mass. There is no tenderness. There is no rebound.   Musculoskeletal: He exhibits no tenderness.  In comparison to the pictures taken in the note on Tuesday, LLE is still erythematous and appears more edematous. No weeping or streaking appreciated.  Lymphadenopathy:    He has no cervical adenopathy.  Neurological:  No focal neuro deficits.  Skin: No rash noted.  Psychiatric: He has a normal mood and affect.    ED Course  Procedures (including critical care time) Labs Review Labs Reviewed - No data to display  Imaging Review No results found.   EKG Interpretation None     Meds given in ED:  Medications  0.9 %  sodium chloride infusion (not administered)  vancomycin (VANCOCIN) IVPB 1000 mg/200 mL premix (not administered)    New Prescriptions   No medications on file   Filed Vitals:   06/09/14 1347 06/09/14 1615  BP: 160/92 137/82  Pulse: 90 71  Temp: 98.1 F (36.7 C) 97.8 F (36.6 C)  TempSrc: Oral Oral  Resp: 18 16  Height: 6' (1.829 m)   Weight: 280 lb (127.007 kg)   SpO2: 100% 100%    MDM  Vitals stable - WNL -afebrile. Pt feeling much better since abx initiated. Pt resting comfortably in ED. Leg appears slightly more erythematous today and more edematous than his evaluation Tuesday- discussed with Dr. Freida Busman, decision to obtain venous US. Vanc given in ED 1 time.  US shows no evidence of DVT or PE. Labwork shows leukocytosis 16.3.  Discussed case with Dr. Freida Busman, due to PE findings while on Bactrim, decision made to have patient admitted for IV antibiotic therapy.   Discussed plan for admission with pt, he was very amenable and appreciative. Final diagnoses:  Cellulitis of left lower extremity   Prior to patient discharge, I discussed and reviewed this case with Dr.Allen          Sharlene Motts, PA-C 06/10/14 1224

## 2014-06-09 NOTE — H&P (Signed)
[facility] Admission History and Physical       ISIDOR Zuniga JWJ:[id number] DOB: 1965-12-15 DOA: 06/09/2014  Referring physician:  EDP PCP: No primary provider on file.  Specialists:   Chief Complaint:  Redness and Swelling of Left leg  HPI: Edward Zuniga is a 48 y.o. male who presents to the ED with complaints of worsening of redness and swelling of his left lower leg over the past 4 days , despite taking 2 days of Bactrim and Keflex which was prescribed at the [facility].  He reports that he had fevers and chills  6 days ago but the redness and swelling of his leg did not occur until 2 days later.     The redness has now spread up to his thigh area.   In the ED, a Venous Du[plex [place] was performed and was negative for a DVT, and he was started on IV Vancomycin and referred for medical admission.     Review of Systems:  Constitutional: No Weight Loss, No Weight Gain, Night Sweats, +Fevers, +Chills, Dizziness, Fatigue, or Generalized Weakness HEENT: No Headaches, Difficulty Swallowing,Tooth/Dental Problems,Sore Throat,  No Sneezing, Rhinitis, Ear Ache, Nasal Congestion, or Post Nasal Drip,  Cardio-vascular:  No Chest pain, Orthopnea, PND, Edema in Lower Extremities, Anasarca, Dizziness, Palpitations  Resp: No Dyspnea, No DOE, No Productive Cough, No Non-Productive Cough, No Hemoptysis, No Wheezing.    GI: No Heartburn, Indigestion, Abdominal Pain, Nausea, Vomiting, Diarrhea, Hematemesis, Hematochezia, Melena, Change in Bowel Habits,  Loss of Appetite  GU: No Dysuria, Change in Color of Urine, No Urgency or Frequency, No Flank pain.  Musculoskeletal: No Joint Pain or Swelling, No Decreased Range of Motion, No Back Pain.  Neurologic: No Syncope, No Seizures, Muscle Weakness, Paresthesia, Vision Disturbance or Loss, No Diplopia, No Vertigo, No Difficulty Walking,  Skin: No Rash or Lesions. Psych: No Change in Mood or Affect, No Depression or Anxiety, No Memory loss, No  Confusion, or Hallucinations   Past Medical History  Diagnosis Date  . Cellulitis      Past Surgical History  Procedure Laterality Date  . Shoulder surgery  2006    left rotator cuff  . Urethral stricture surgery  approx 2004      Prior to Admission medications   Medication Sig Start Date End Date Taking? Authorizing Provider  cephALEXin (KEFLEX) 500 MG capsule 2 caps po bid x 7 days 06/07/14  Yes Mercedes Strupp Camprubi-Soms, PA-C  ibuprofen (ADVIL,MOTRIN) 800 MG tablet Take 800 mg by mouth 2 (two) times daily as needed for moderate pain.   Yes Historical Provider, MD  Multiple Vitamin (MULTIVITAMIN WITH MINERALS) TABS tablet Take 1 tablet by mouth daily.   Yes Historical Provider, MD  naproxen (NAPROSYN) 500 MG tablet Take 1 tablet (500 mg total) by mouth 2 (two) times daily as needed for mild pain, moderate pain or headache (TAKE WITH MEALS.). 06/07/14  Yes Mercedes Strupp Camprubi-Soms, PA-C  oxyCODONE-acetaminophen (PERCOCET) 5-325 MG per tablet Take 1 tablet by mouth every 6 (six) hours as needed for severe pain. 06/07/14  Yes Mercedes Strupp Camprubi-Soms, PA-C  promethazine (PHENERGAN) 25 MG tablet Take 25 mg by mouth every 6 (six) hours as needed for nausea or vomiting.   Yes Historical Provider, MD  sulfamethoxazole-trimethoprim (BACTRIM DS) 800-160 MG per tablet Take 1 tablet by mouth 2 (two) times daily. 06/07/14  Yes Mercedes Strupp Camprubi-Soms, PA-C  potassium chloride SA (K-DUR,KLOR-CON) 20 MEQ tablet Take 20 mEq by mouth daily.  Historical Provider, MD     Allergies  Allergen Reactions  . Other     Causes stomach cramps     Social History:  reports that he has never smoked. He has never used smokeless tobacco. He reports that he does not drink alcohol or use illicit drugs.     History reviewed. No pertinent family history.     Physical Exam:  GEN:  Edward Zuniga 48 y.o.  [place] male  examined  and in no acute distress; cooperative with  exam Filed Vitals:   06/09/14 1347 06/09/14 1615 06/09/14 1924  BP: 160/92 137/82 135/76  Pulse: 90 71 75  Temp: 98.1 F (36.7 C) 97.8 F (36.6 C) 98.1 F (36.7 C)  TempSrc: Oral Oral Oral  Resp: Height: 6' (1.829 m)    Weight: 127.007 kg (280 lb)    SpO2: 100% 100% 100%   Blood pressure 135/76, pulse 75, temperature 98.1 F (36.7 C), temperature source Oral, resp. rate 16, height 6' (1.829 m), weight 127.007 kg (280 lb), SpO2 100.00%. PSYCH: SHe is alert and oriented x4; does not appear anxious does not appear depressed; affect is normal HEENT: Normocephalic and Atraumatic, Mucous membranes pink; PERRLA; EOM intact; Fundi:  Benign;  No scleral icterus, Nares: Patent, Oropharynx: Clear,  Fair Dentition, Neck:  FROM, No Cervical Lymphadenopathy nor Thyromegaly or Carotid Bruit; No JVD; Breasts:: Not examined CHEST WALL: No tenderness CHEST: Normal respiration, clear to auscultation bilaterally HEART: Regular rate and rhythm; no murmurs rubs or gallops BACK: No kyphosis or scoliosis; No CVA tenderness ABDOMEN: Positive Bowel Sounds, Scaphoid, Zuniga, Soft Non-Tender; No Masses, No Organomegaly, No Pannus; No Intertriginous candida. Rectal Exam: Not done EXTREMITIES:  3+ Edema LLE with confluent Erythema form the distal aspect of the LLE to the lower the suprapatellar area No Ulcerations; RLE:   No Cyanosis, Clubbing, or Edema; No Ulcerations. Genitalia: not examined PULSES: 2+ and symmetric SKIN: Normal hydration no rash or ulceration CNS:  Alert and Oriented x 4, No Focal Deficits  Vascular: pulses palpable throughout    Labs on Admission:  Basic Metabolic Panel:  Recent Labs Lab 06/07/14 0100 06/09/14 1707  NA 140 139  K 4.1 4.1  CL 100 101  CO2 23 24  GLUCOSE 70 88  BUN 16 13  CREATININE 0.92 0.85  CALCIUM 10.1 9.6   Liver Function Tests:  Recent Labs Lab 06/09/14 1707  AST 20  ALT 35  ALKPHOS 123*  BILITOT <0.2*  PROT 7.9  ALBUMIN 3.1*   No  results found for this basename: LIPASE, AMYLASE,  in the last 168 hours No results found for this basename: AMMONIA,  in the last 168 hours CBC:  Recent Labs Lab 06/07/14 0100 06/09/14 1707  WBC 17.6* 16.3*  NEUTROABS 14.1* 12.7*  HGB 14.1 13.1  HCT 40.5 38.5*  MCV 87.1 89.1  PLT 224 249   Cardiac Enzymes: No results found for this basename: CKTOTAL, CKMB, CKMBINDEX, TROPONINI,  in the last 168 hours  BNP (last 3 results) No results found for this basename: PROBNP,  in the last 8760 hours CBG: No results found for this basename: GLUCAP,  in the last 168 hours  Radiological Exams on Admission: No results found.     Assessment/Plan:   48 y.o. male with  Principal Problem:   Cellulitis and abscess of leg, except foot  IV Vancomycin  Pain Control PRN.         DVT Prophylaxis   Lovenox  Code Status:   FULL CODE Family Communication:    No Family present Disposition Plan:     Observation  Time spent:  78 Minutes  Ron Parker [facility] Pager [phone]   If 7AM -7PM Please Contact the Day Rounding Team MD for [facility]  If 7PM-7AM, Please Contact night-coverage  [email or web address] Password [facility] 06/09/2014, 8:04 PM

## 2014-06-09 NOTE — Progress Notes (Signed)
*  Preliminary Results* Left lower extremity venous duplex completed. The visualized veins of the left lower extremity are negative for deep vein thrombosis. There is no evidence of left Baker's cyst.  06/09/2014 4:56 PM  Gertie Fey, RVT, RDCS, RDMS

## 2014-06-09 NOTE — ED Provider Notes (Signed)
Medical screening examination/treatment/procedure(s) were conducted as a shared visit with non-physician practitioner(s) and myself.  I personally evaluated the patient during the encounter.   EKG Interpretation None     Pt here with left le swelling and erythema--seen here recently and tx for cellulitis with bactrim--swelling continues and when compared to photo in chart he has more medial erythema--will repeat labs, give dose of vancomycin, and check doppler  Toy Baker, MD 06/09/14 [phone]

## 2014-06-09 NOTE — ED Notes (Signed)
RN sts room is still dirty at this time.

## 2014-06-09 NOTE — ED Notes (Signed)
Pt presents in NAD- seen and treated here at [facility]. For cellulitis to the left leg. Pt here for re evaluation. Pt compliant with meds. Pt states redness and swelling has increased up to thigh. No fever.

## 2014-06-09 NOTE — ED Notes (Signed)
Doppler study at bedside. Will draw labs when complete.

## 2014-06-09 NOTE — Progress Notes (Signed)
ANTIBIOTIC CONSULT NOTE - INITIAL  Pharmacy Consult for Vancomycin Indication: cellulitis  Allergies  Allergen Reactions  . Other     Causes stomach cramps    Patient Measurements: Height: 6' (182.9 cm) Weight: 280 lb (127.007 kg) IBW/kg (Calculated) : 77.6  Vital Signs: Temp: 98.1 F (36.7 C) (09/18 1924) Temp src: Oral (09/18 1924) BP: 135/76 mmHg (09/18 1924) Pulse Rate: 75 (09/18 1924) Intake/Output from previous day:    Labs:  Recent Labs  06/07/14 0100 06/09/14 1707  WBC 17.6* 16.3*  HGB 14.1 13.1  PLT 224 249  CREATININE 0.92 0.85   Estimated Creatinine Clearance: 146.4 ml/min (by C-G formula based on Cr of 0.85). No results found for this basename: VANCOTROUGH, VANCOPEAK, VANCORANDOM, GENTTROUGH, GENTPEAK, GENTRANDOM, TOBRATROUGH, TOBRAPEAK, TOBRARND, AMIKACINPEAK, AMIKACINTROU, AMIKACIN,  in the last 72 hours   Microbiology: No results found for this or any previous visit (from the past 720 hour(s)).  Medical History: Past Medical History  Diagnosis Date  . Cellulitis     Medications:  Anti-infectives   Start     Dose/Rate Route Frequency Ordered Stop   06/09/14 1630  vancomycin (VANCOCIN) IVPB 1000 mg/200 mL premix     1,000 mg 200 mL/hr over 60 Minutes Intravenous  Once 06/09/14 1625 06/09/14 1804     Assessment: 48 yoM admitted 9/18 for re-evaluation of LLE cellulitis.  He was seen Tuesday, 9/15, and discharged with Bactrim.  His leg has increasing erythema and he will be admitted for IV abx.  Pharmacy is consulted to dose vancomycin.  9/18 >> Vanc >>  Tmax: 98.1 WBCs: 16.3 Renal: SCr 0.85, CrCl > 100 CG, N   Goal of Therapy:  Vancomycin trough level 10-15 mcg/ml  Plan:   Vancomycin  IV once (in addition to previous 1g for total loading dose )  Vancomycin 1250 IV q12h.  Measure Vanc trough at steady state.  Follow up renal fxn and culture results.   Lynann Beaver PharmD, BCPS Pager [phone] 06/09/2014 8:07  PM

## 2014-06-10 DIAGNOSIS — D72829 Elevated white blood cell count, unspecified: Secondary | ICD-10-CM | POA: Diagnosis present

## 2014-06-10 LAB — CBC
HCT: 37.3 % — ABNORMAL LOW (ref 39.0–52.0)
HEMOGLOBIN: 12.5 g/dL — AB (ref 13.0–17.0)
MCH: 30 pg (ref 26.0–34.0)
MCHC: 33.5 g/dL (ref 30.0–36.0)
MCV: 89.4 fL (ref 78.0–100.0)
Platelets: 261 10*3/uL (ref 150–400)
RBC: 4.17 MIL/uL — ABNORMAL LOW (ref 4.22–5.81)
RDW: 14.1 % (ref 11.5–15.5)
WBC: 15.2 10*3/uL — ABNORMAL HIGH (ref 4.0–10.5)

## 2014-06-10 LAB — BASIC METABOLIC PANEL
Anion gap: 11 (ref 5–15)
BUN: 10 mg/dL (ref 6–23)
CHLORIDE: 104 meq/L (ref 96–112)
CO2: 26 meq/L (ref 19–32)
Calcium: 9.3 mg/dL (ref 8.4–10.5)
Creatinine, Ser: 0.92 mg/dL (ref 0.50–1.35)
GFR calc Af Amer: >90 mL/min (ref >90)
GFR calc non Af Amer: >90 mL/min (ref >90)
GLUCOSE: 87 mg/dL (ref 70–99)
POTASSIUM: 4.6 meq/L (ref 3.7–5.3)
Sodium: 141 mEq/L (ref 137–147)

## 2014-06-10 NOTE — Progress Notes (Signed)
Patient ID: Edward Zuniga, male   DOB: May 20, 1966, 48 y.o.   MRN: [id number] [facility] PROGRESS NOTE  Edward Zuniga WUJ:[id number] DOB: [birth year] DOA: 06/09/2014 PCP: No primary provider on file.  Brief narrative: 48 y.o. male with no significant past medical history who presented to [facility] ED 06/09/2014 with worsening left lower extremity swelling. Pt reported he initially had fevers and chills about 1 week PTA. Then about 2 days ago he started noticing left leg swelling. He was on bactrim and Keflex outpt given by MD in [facility]. In ED, vitals were stable. He was started on vanco for cellulitis and admitted for further management.   Assessment/Plan:    Principal Problem:   Left lower extremity cellulitis  Not entirely clear what the provoking event was; LE doppler negative for DVT  On vancomycin and pt reported swelling and redness is improving   May continue supportive care with analgesia as needed Active Problems:   Leukocytosis, unspecified  Secondary to cellulitis  Management as above     DVT Prophylaxis   Lovenox subQ  Code Status: Full.  Family Communication:  plan of care discussed with the patient Disposition Plan: Home when stable.    IV Access:   Peripheral IV Procedures and diagnostic studies:   No results found. Medical Consultants:   None  Other Consultants:   None  Anti-Infectives:   Vancomycin 06/09/2014 -->   Manson Passey, MD  [facility] Pager [phone]  If 7PM-7AM, please contact night-coverage [email or web address] Password [email or web address] 06/10/2014, 3:26 PM   LOS: 1 day    HPI/Subjective: No acute overnight events.  Objective: Filed Vitals:   06/09/14 2028 06/10/14 0210 06/10/14 0558 06/10/14 1448  BP:  132/81 134/77 165/89  Pulse:  79 82 80  Temp:  98.1 F (36.7 C) 98.1 F (36.7 C) 98.5 F (36.9 C)  TempSrc:  Oral Oral Oral  Resp:  Height: 6' (1.829 m)     Weight: 127.1 kg (280 lb 3.3 oz)     SpO2:  100% 100% 100%     Intake/Output Summary (Last 24 hours) at 06/10/14 1526 Last data filed at 06/10/14 0900  Gross per 24 hour  Intake    960 ml  Output      0 ml  Net    960 ml    Exam:   General:  Pt is alert, follows commands appropriately, not in acute distress  Cardiovascular: Regular rate and rhythm, S1/S2, no murmurs  Respiratory: Clear to auscultation bilaterally, no wheezing, no crackles, no rhonchi  Abdomen: Soft, non tender, non distended, bowel sounds present  Extremities: Left lower extremity swelling, warmth, tenderness but getting better per patient, pulses DP and PT palpable bilaterally  Neuro: Grossly nonfocal  Data Reviewed: Basic Metabolic Panel:  Recent Labs Lab 06/07/14 0100 06/09/14 1707 06/10/14 0541  NA 140 139 141  K 4.1 4.1 4.6  CL 100 101 104  CO2 GLUCOSE 70 88 87  BUN CREATININE 0.92 0.85 0.92  CALCIUM 10.1 9.6 9.3   Liver Function Tests:  Recent Labs Lab 06/09/14 1707  AST 20  ALT 35  ALKPHOS 123*  BILITOT <0.2*  PROT 7.9  ALBUMIN 3.1*   No results found for this basename: LIPASE, AMYLASE,  in the last 168 hours No results found for this basename: AMMONIA,  in the last 168 hours CBC:  Recent Labs Lab 06/07/14 0100 06/09/14 1707 06/10/14 0541  WBC 17.6* 16.3*  15.2*  NEUTROABS 14.1* 12.7*  --   HGB 14.1 13.1 12.5*  HCT 40.5 38.5* 37.3*  MCV 87.1 89.1 89.4  PLT 224 249 261   Cardiac Enzymes: No results found for this basename: CKTOTAL, CKMB, CKMBINDEX, TROPONINI,  in the last 168 hours BNP: No components found with this basename: POCBNP,  CBG: No results found for this basename: GLUCAP,  in the last 168 hours  No results found for this or any previous visit (from the past 240 hour(s)).   Scheduled Meds: . enoxaparin (LOVENOX) injection  60 mg Subcutaneous Q24H  . vancomycin  1,250 mg Intravenous Q12H   Continuous Infusions: . sodium chloride 20 mL/hr (06/09/14 1704)  . sodium chloride 75 mL/hr at  06/10/14 [phone]

## 2014-06-10 NOTE — ED Provider Notes (Signed)
Medical screening examination/treatment/procedure(s) were conducted as a shared visit with non-physician practitioner(s) and myself.  I personally evaluated the patient during the encounter.   EKG Interpretation None       Toy Baker, MD 06/10/14 1642

## 2014-06-11 MED ORDER — DOXYCYCLINE HYCLATE 100 MG PO TABS: 100.0000 mg | ORAL_TABLET | Freq: Two times a day (BID) | ORAL | Status: DC

## 2014-06-11 MED ORDER — OXYCODONE-ACETAMINOPHEN 5-325 MG PO TABS: 1.0000 | ORAL_TABLET | Freq: Three times a day (TID) | ORAL | Status: DC | PRN

## 2014-06-11 NOTE — Discharge Instructions (Signed)

## 2014-06-11 NOTE — Discharge Summary (Signed)
Physician Discharge Summary  Edward Zuniga ZOX:[id number] DOB: August 23, 1966 DOA: 06/09/2014  PCP: No primary provider on file.  Admit date: 06/09/2014 Discharge date: 06/11/2014  Recommendations for Outpatient Follow-up:  1. Continue with doxycycline on discharge for 10 more days. 2. [facility] is closed today but I will make an appt for the pt to follow up and will call the pt at his home number to inform him of sch appt, home number: [phone]  Discharge Diagnoses:  Principal Problem:   Cellulitis and abscess of leg, except foot Active Problems:   Leukocytosis, unspecified    Discharge Condition: stable   Diet recommendation: as tolerated   History of present illness:  48 y.o. male with no significant past medical history who presented to [facility] ED 06/09/2014 with worsening left lower extremity swelling. Pt reported he initially had fevers and chills about 1 week PTA. Then about 2 days ago he started noticing left leg swelling. He was on bactrim and Keflex outpt given by MD in [facility].  In ED, vitals were stable. He was started on vanco for cellulitis and admitted for further management.   Assessment/Plan:   Principal Problem:  Left lower extremity cellulitis  Not entirely clear what the provoking event was; LE doppler negative for DVT  Swelling improved with vancomycin Doxycycline on discharge for 10 days. He failed tx with bactrim and keflex. Active Problems:  Leukocytosis, unspecified  Secondary to cellulitis  Management as above  DVT Prophylaxis  Lovenox subQ while pt is in hospital   Code Status: Full.  Family Communication: plan of care discussed with the patient  Disposition Plan: Home today.    IV Access:   Peripheral IV Procedures and diagnostic studies:   No results found.  Medical Consultants:   None  Other Consultants:   None  Anti-Infectives:   Vancomycin 06/09/2014 --> 06/11/2014 Doxycycline for 10 days on discharge, 100 mg PO BID   Signed:  Manson Passey,  MD  [facility] 06/11/2014, 9:14 AM  Pager #: [phone]   Discharge Exam: Filed Vitals:   06/11/14 0622  BP: 137/78  Pulse: 89  Temp: 97.4 F (36.3 C)  Resp: 20   Filed Vitals:   06/10/14 1448 06/10/14 2134 06/11/14 0144 06/11/14 0622  BP: 165/89 141/85 142/88 137/78  Pulse: 80 81 83 89  Temp: 98.5 F (36.9 C) 98.2 F (36.8 C) 98.6 F (37 C) 97.4 F (36.3 C)  TempSrc: Oral Oral Oral Oral  Resp: Height:      Weight:      SpO2: 100% 100% 100% 98%    General: Pt is alert, follows commands appropriately, not in acute distress Cardiovascular: Regular rate and rhythm, S1/S2 +, no murmurs Respiratory: Clear to auscultation bilaterally, no wheezing, no crackles, no rhonchi Abdominal: Soft, non tender, non distended, bowel sounds +, no guarding Extremities: left lower extremity swelling improving, no cyanosis, pulses palpable bilaterally DP and PT Neuro: Non-focal  Discharge Instructions  Discharge Instructions   Call MD for:  difficulty breathing, headache or visual disturbances    Complete by:  As directed      Call MD for:  persistant dizziness or light-headedness    Complete by:  As directed      Call MD for:  persistant nausea and vomiting    Complete by:  As directed      Call MD for:  severe uncontrolled pain    Complete by:  As directed      Diet -  low sodium heart healthy    Complete by:  As directed      Discharge instructions    Complete by:  As directed   Take doxycycline 100 mg twice a day for 10 days on discharge for cellulitis.     Increase activity slowly    Complete by:  As directed             Medication List    STOP taking these medications       cephALEXin 500 MG capsule  Commonly known as:  KEFLEX     ibuprofen 800 MG tablet  Commonly known as:  ADVIL,MOTRIN     naproxen 500 MG tablet  Commonly known as:  NAPROSYN     potassium chloride SA 20 MEQ tablet  Commonly known as:  K-DUR,KLOR-CON     promethazine  25 MG tablet  Commonly known as:  PHENERGAN     sulfamethoxazole-trimethoprim 800-160 MG per tablet  Commonly known as:  BACTRIM DS      TAKE these medications       doxycycline 100 MG tablet  Commonly known as:  VIBRA-TABS  Take 1 tablet (100 mg total) by mouth 2 (two) times daily.     multivitamin with minerals Tabs tablet  Take 1 tablet by mouth daily.     oxyCODONE-acetaminophen 5-325 MG per tablet  Commonly known as:  PERCOCET  Take 1 tablet by mouth every 8 (eight) hours as needed for severe pain.           Follow-up Information   Follow up with [facility]    . Schedule an appointment as soon as possible for a visit in 2 weeks. (Follow up appt after recent hospitalization)    Contact information:   [address] [place] [address] [phone]       The results of significant diagnostics from this hospitalization (including imaging, microbiology, ancillary and laboratory) are listed below for reference.    Significant Diagnostic Studies: No results found.  Microbiology: No results found for this or any previous visit (from the past 240 hour(s)).   Labs: Basic Metabolic Panel:  Recent Labs Lab 06/07/14 0100 06/09/14 1707 06/10/14 0541  NA 140 139 141  K 4.1 4.1 4.6  CL 100 101 104  CO2 GLUCOSE 70 88 87  BUN CREATININE 0.92 0.85 0.92  CALCIUM 10.1 9.6 9.3   Liver Function Tests:  Recent Labs Lab 06/09/14 1707  AST 20  ALT 35  ALKPHOS 123*  BILITOT <0.2*  PROT 7.9  ALBUMIN 3.1*   No results found for this basename: LIPASE, AMYLASE,  in the last 168 hours No results found for this basename: AMMONIA,  in the last 168 hours CBC:  Recent Labs Lab 06/07/14 0100 06/09/14 1707 06/10/14 0541  WBC 17.6* 16.3* 15.2*  NEUTROABS 14.1* 12.7*  --   HGB 14.1 13.1 12.5*  HCT 40.5 38.5* 37.3*  MCV 87.1 89.1 89.4  PLT 224 249 261   Cardiac Enzymes: No results found for this basename:  CKTOTAL, CKMB, CKMBINDEX, TROPONINI,  in the last 168 hours BNP: BNP (last 3 results) No results found for this basename: PROBNP,  in the last 8760 hours CBG: No results found for this basename: GLUCAP,  in the last 168 hours  Time coordinating discharge: Over 30 minutes

## 2014-06-11 NOTE — Progress Notes (Signed)
Patient was stable at time of discharge. IV was removed. I reviewed discharge education with patient. He verbalized understanding. Patient left with belongings and prescription in hand.

## 2014-06-19 ENCOUNTER — Inpatient Hospital Stay: Payer: Self-pay | Admitting: Family Medicine

## 2014-06-23 ENCOUNTER — Encounter: Payer: Self-pay | Admitting: Family Medicine

## 2014-06-23 ENCOUNTER — Ambulatory Visit: Payer: Self-pay | Attending: Family Medicine | Admitting: Family Medicine

## 2014-06-23 VITALS — BP 127/80 | HR 82 | Temp 98.2°F | Resp 18 | Ht 72.0 in | Wt 314.0 lb

## 2014-06-23 DIAGNOSIS — L03116 Cellulitis of left lower limb: Secondary | ICD-10-CM | POA: Insufficient documentation

## 2014-06-23 DIAGNOSIS — L03119 Cellulitis of unspecified part of limb: Secondary | ICD-10-CM

## 2014-06-23 DIAGNOSIS — D72829 Elevated white blood cell count, unspecified: Secondary | ICD-10-CM | POA: Insufficient documentation

## 2014-06-23 DIAGNOSIS — Z6841 Body Mass Index (BMI) 40.0 and over, adult: Secondary | ICD-10-CM | POA: Insufficient documentation

## 2014-06-23 DIAGNOSIS — Z113 Encounter for screening for infections with a predominantly sexual mode of transmission: Secondary | ICD-10-CM | POA: Insufficient documentation

## 2014-06-23 DIAGNOSIS — E669 Obesity, unspecified: Secondary | ICD-10-CM | POA: Insufficient documentation

## 2014-06-23 DIAGNOSIS — Z114 Encounter for screening for human immunodeficiency virus [HIV]: Secondary | ICD-10-CM | POA: Insufficient documentation

## 2014-06-23 DIAGNOSIS — L02419 Cutaneous abscess of limb, unspecified: Secondary | ICD-10-CM

## 2014-06-23 LAB — CBC
HCT: 41.7 % (ref 39.0–52.0)
HEMOGLOBIN: 14.6 g/dL (ref 13.0–17.0)
MCH: 30 pg (ref 26.0–34.0)
MCHC: 35 g/dL (ref 30.0–36.0)
MCV: 85.6 fL (ref 78.0–100.0)
Platelets: 497 10*3/uL — ABNORMAL HIGH (ref 150–400)
RBC: 4.87 MIL/uL (ref 4.22–5.81)
RDW: 14.5 % (ref 11.5–15.5)
WBC: 7.2 10*3/uL (ref 4.0–10.5)

## 2014-06-23 MED ORDER — TRAMADOL HCL 50 MG PO TABS: 50.0000 mg | ORAL_TABLET | Freq: Three times a day (TID) | ORAL | Status: DC | PRN

## 2014-06-23 NOTE — Assessment & Plan Note (Signed)
A: improving. P: CBC Tramadol and NSAID recommended for pain control

## 2014-06-23 NOTE — Progress Notes (Signed)
[facility] cellulitis on lt leg still with continues pian

## 2014-06-23 NOTE — Patient Instructions (Signed)
Mr. Edward Zuniga,  Thank you for coming in today.  For L leg cellulitis Transition from percocet to tramadol for pain control. Elevate legs as much as possible. If you can tolerate take an oral antiinflammatory like ibuprofen 600 mg three times daily  as needed or naproxen 500 mg three times daily.   You will be called with lab results.   F/u in 2-3 weeks for physical   Dr. Armen PickupFunches

## 2014-06-23 NOTE — Assessment & Plan Note (Signed)
Screening HIV  

## 2014-06-23 NOTE — Progress Notes (Signed)
   Subjective:    Patient ID: Edward Zuniga, male    DOB: 12/17/1965, 48 y.o.   MRN: [id number] CC: HFU for left leg cellullitis  HPI  1. Left leg cellulitis:  06/02/14 started with fever and pain. Went to urgent care on 06/03/14, went back on 06/05/14. On 06/07/14 went to ED and was started on antibiotics. Was hospitalized from 9/19-9/20. LE dopplers negative. Finished doxy and percocet. No fever now. Still with residual pain and swelling, improving. Elevating L leg as much as possible.  Soc hx: non smoker  Review of Systems As per HPI     Objective:   Physical Exam BP 127/80  Pulse 82  Temp(Src) 98.2 F (36.8 C) (Oral)  Resp 18  Ht 6' (1.829 m)  Wt 314 lb (142.429 kg)  BMI 42.58 kg/m2  SpO2 97% General appearance: alert, cooperative, no distress and moderately obese Extremities: L leg larger than R with trace edema and skin peeling consistent with recent infection. No warmth. Mildly tender.  Lymph nodes: Inguinal lymph nodes normal        Assessment & Plan:

## 2014-06-23 NOTE — Assessment & Plan Note (Signed)
A: elevated WBC in the setting of recent infection. Anticipate resolution P: Repeat CBC

## 2014-06-24 LAB — HIV ANTIBODY (ROUTINE TESTING W REFLEX): HIV 1&2 Ab, 4th Generation: NONREACTIVE

## 2014-06-26 ENCOUNTER — Telehealth: Payer: Self-pay | Admitting: *Deleted

## 2014-06-26 NOTE — Telephone Encounter (Signed)
Message copied by Dyann KiefGIRALDEZ, Kodee Ravert M on Mon Jun 26, 2014  5:42 PM ------      Message from: Dessa PhiFUNCHES, JOSALYN      Created: Mon Jun 26, 2014 12:04 PM       Normal WBC      HIV negative ------

## 2014-06-26 NOTE — Telephone Encounter (Signed)
Left message with Everett GraffEvelyn Gasbarro, normal labs if any question return call

## 2014-07-14 ENCOUNTER — Ambulatory Visit: Payer: Self-pay

## 2018-02-13 ENCOUNTER — Emergency Department (HOSPITAL_BASED_OUTPATIENT_CLINIC_OR_DEPARTMENT_OTHER)
Admission: EM | Admit: 2018-02-13 | Discharge: 2018-02-13 | Disposition: A | Discharge status: Home / Self Care | Payer: Self-pay | Attending: Emergency Medicine | Admitting: Emergency Medicine

## 2018-02-13 ENCOUNTER — Encounter (HOSPITAL_BASED_OUTPATIENT_CLINIC_OR_DEPARTMENT_OTHER): Payer: Self-pay | Admitting: *Deleted

## 2018-02-13 ENCOUNTER — Other Ambulatory Visit: Payer: Self-pay

## 2018-02-13 DIAGNOSIS — H60501 Unspecified acute noninfective otitis externa, right ear: Secondary | ICD-10-CM | POA: Insufficient documentation

## 2018-02-13 DIAGNOSIS — Y998 Other external cause status: Secondary | ICD-10-CM | POA: Insufficient documentation

## 2018-02-13 DIAGNOSIS — Y92016 Swimming-pool in single-family (private) house or garden as the place of occurrence of the external cause: Secondary | ICD-10-CM | POA: Insufficient documentation

## 2018-02-13 DIAGNOSIS — W228XXA Striking against or struck by other objects, initial encounter: Secondary | ICD-10-CM | POA: Insufficient documentation

## 2018-02-13 DIAGNOSIS — T161XXA Foreign body in right ear, initial encounter: Secondary | ICD-10-CM | POA: Insufficient documentation

## 2018-02-13 DIAGNOSIS — Z79899 Other long term (current) drug therapy: Secondary | ICD-10-CM | POA: Insufficient documentation

## 2018-02-13 DIAGNOSIS — Y9311 Activity, swimming: Secondary | ICD-10-CM | POA: Insufficient documentation

## 2018-02-13 MED ORDER — CIPROFLOXACIN-HYDROCORTISONE 0.2-1 % OT SUSP: 3.0000 [drp] | Freq: Two times a day (BID) | OTIC | 0 refills | Status: AC

## 2018-02-13 NOTE — ED Provider Notes (Signed)
[facility] EMERGENCY DEPARTMENT Provider Note   CSN: [id number] Arrival date & time: 02/13/18  0139     History   Chief Complaint Chief Complaint  Patient presents with  . Otalgia    HPI Edward Zuniga is a 52 y.o. male.  HPI Patient is a 52 year old male who was swims frequently for exercise who presents the emergency department with Q-tip in his right ear.  He states he felt like he had water in it several days ago and tried to clean it out with a Q-tip and he lost the cotton swab.  Since then he has had a fullness in his right ear and increasing right ear pain.  No fevers or chills.  No other complaints.  No drainage from his right ear.  Symptoms are mild to moderate in severity.   Past Medical History:  Diagnosis Date  . Cellulitis     Patient Active Problem List   Diagnosis Date Noted  . Screen for STD (sexually transmitted disease) 06/23/2014  . Leukocytosis 06/10/2014  . Cellulitis and abscess of leg, except foot 06/09/2014    Past Surgical History:  Procedure Laterality Date  . SHOULDER SURGERY  2006   left rotator cuff  . urethral stricture surgery  approx 2004        Home Medications    Prior to Admission medications   Medication Sig Start Date End Date Taking? Authorizing Provider  ciprofloxacin-hydrocortisone (CIPRO HC) OTIC suspension Place 3 drops into the right ear 2 (two) times daily for 7 days. 02/13/18 02/20/18  Azalia Bilis, MD  doxycycline (VIBRA-TABS) 100 MG tablet Take 1 tablet (100 mg total) by mouth 2 (two) times daily. 06/11/14   Alison Murray, MD  Multiple Vitamin (MULTIVITAMIN WITH MINERALS) TABS tablet Take 1 tablet by mouth daily.    [provider]  oxyCODONE-acetaminophen (PERCOCET) 5-325 MG per tablet Take 1 tablet by mouth every 8 (eight) hours as needed for severe pain. 06/11/14   Alison Murray, MD  traMADol (ULTRAM) 50 MG tablet Take 1-2 tablets (50-100 mg total) by mouth every 8 (eight) hours as needed for  severe pain. 06/23/14   Dessa Phi, MD    Family History No family history on file.  Social History Social History   Tobacco Use  . Smoking status: Never Smoker  . Smokeless tobacco: Never Used  Substance Use Topics  . Alcohol use: No    Comment: occ  . Drug use: No     Allergies   Other   Review of Systems Review of Systems  All other systems reviewed and are negative.    Physical Exam Updated Vital Signs BP (!) 139/91 (BP Location: Left Arm)   Pulse 77   Temp 98.9 F (37.2 C) (Oral)   Resp 20   SpO2 98%   Physical Exam  Constitutional: He is oriented to person, place, and time. He appears well-developed and well-nourished.  HENT:  Head: Normocephalic.  Mild swelling of the right external auditory canal.  Cotton swab found at the proximal aspect of the external auditory canal, next to the right TM.  No bleeding.  No drainage from the right ear.  No mastoid tenderness  Eyes: EOM are normal.  Neck: Normal range of motion.  Pulmonary/Chest: Effort normal.  Abdominal: He exhibits no distension.  Musculoskeletal: Normal range of motion.  Neurological: He is alert and oriented to person, place, and time.  Psychiatric: He has a normal mood and affect.  Nursing note and  vitals reviewed.    ED Treatments / Results  Labs (all labs ordered are listed, but only abnormal results are displayed) Labs Reviewed - No data to display  EKG None  Radiology No results found.  Procedures .Foreign Body Removal Performed by: Azalia Bilis, MD Authorized by: Azalia Bilis, MD  Consent: Verbal consent obtained. Consent given by: patient Body area: ear Location details: right ear Patient cooperative: yes Localization method: visualized Removal mechanism: irrigation Complexity: simple 1 objects recovered. Objects recovered: cotton swab Post-procedure assessment: foreign body removed Patient tolerance: Patient tolerated the procedure well with no immediate  complications   (including critical care time)  Medications Ordered in ED Medications - No data to display   Initial Impression / Assessment and Plan / ED Course  I have reviewed the triage vital signs and the nursing notes.  Pertinent labs & imaging results that were available during my care of the patient were reviewed by me and considered in my medical decision making (see chart for details).     Cotton swab removed with gentle irrigation.  Patient with improvement in his symptoms.  He still has evidence of what appears to be a developing otitis externa.  Patient will be placed on antibiotic drops.  He understands return to the ER for new or worsening symptoms.  Evaluation of the TM after irrigation demonstrates no erythema and no evidence of perforation.  Final Clinical Impressions(s) / ED Diagnoses   Final diagnoses:  Acute otitis externa of right ear, unspecified type  Foreign body of right ear, initial encounter    ED Discharge Orders        Ordered    ciprofloxacin-hydrocortisone (CIPRO HC) OTIC suspension  2 times daily     02/13/18 0317       Azalia Bilis, MD 02/13/18 [id number]

## 2018-02-13 NOTE — ED Triage Notes (Signed)
Right ear pain x 4 days

## 2019-08-05 ENCOUNTER — Other Ambulatory Visit: Payer: Self-pay

## 2019-08-05 DIAGNOSIS — Z20822 Contact with and (suspected) exposure to covid-19: Secondary | ICD-10-CM

## 2019-08-07 LAB — NOVEL CORONAVIRUS, NAA: SARS-CoV-2, NAA: NOT DETECTED

## 2021-07-09 ENCOUNTER — Encounter: Payer: Self-pay | Admitting: Internal Medicine

## 2021-07-09 ENCOUNTER — Other Ambulatory Visit: Payer: Self-pay

## 2021-07-09 ENCOUNTER — Ambulatory Visit: Payer: Self-pay | Attending: Internal Medicine | Admitting: Internal Medicine

## 2021-07-09 VITALS — BP 156/98 | HR 60 | Resp 16 | Wt 339.6 lb

## 2021-07-09 DIAGNOSIS — Z1159 Encounter for screening for other viral diseases: Secondary | ICD-10-CM

## 2021-07-09 DIAGNOSIS — Z23 Encounter for immunization: Secondary | ICD-10-CM

## 2021-07-09 DIAGNOSIS — Z1211 Encounter for screening for malignant neoplasm of colon: Secondary | ICD-10-CM

## 2021-07-09 DIAGNOSIS — Z125 Encounter for screening for malignant neoplasm of prostate: Secondary | ICD-10-CM

## 2021-07-09 DIAGNOSIS — R030 Elevated blood-pressure reading, without diagnosis of hypertension: Secondary | ICD-10-CM

## 2021-07-09 DIAGNOSIS — Z7689 Persons encountering health services in other specified circumstances: Secondary | ICD-10-CM

## 2021-07-09 NOTE — Patient Instructions (Signed)

## 2021-07-09 NOTE — Progress Notes (Signed)
Patient ID: Edward Zuniga, male    DOB: January 01, 1966  MRN: [id number]  CC: New Patient (Initial Visit)   Subjective: Edward Zuniga is a 55 y.o. male who presents for new pt visit His concerns today include:   Previous PCP was Dr. Armen Pickup who left in 2018. Not on ant rxn meds  HM:  had 3 COVID vaccines. Due for flu, Tdapt and shingrix vaccine. Due for colon CA screen; no fhx hx of colon CA.   BP elev today.  No previous hx of HTN.   Does not use a lot of salt in his foods No CP/SOB/LE edema/HA  Obesity:  working on getting wgh down.  Gained 50 lbs during COVID pandemic.  Goes to gym in a.m 4x/wk - TM, bikes, wgh and swimming.  Started back in 09/2020.  Feels he is doing okay with eating except he eats late at nights.  Drinks more water.   Social, family history, surgical history reviewed and updated. Patient Active Problem List   Diagnosis Date Noted   Screen for STD (sexually transmitted disease) 06/23/2014   Leukocytosis 06/10/2014   Cellulitis and abscess of leg, except foot 06/09/2014     No current outpatient medications on file prior to visit.   No current facility-administered medications on file prior to visit.    Allergies  Allergen Reactions   Other     Bananas causes stomach cramps    Social History   Socioeconomic History   Marital status: Single    Spouse name: Not on file   Number of children: 1   Years of education: Not on file   Highest education level: Master's degree (e.g., MA, MS, MEng, MEd, MSW, MBA)  Occupational History   Occupation: unemployed  Tobacco Use   Smoking status: Never   Smokeless tobacco: Never  Vaping Use   Vaping Use: Never used  Substance and Sexual Activity   Alcohol use: No    Comment: occ   Drug use: No   Sexual activity: Not on file  Other Topics Concern   Not on file  Social History Narrative   Not on file   Social Determinants of Health   Financial Resource Strain: Not on file  Food Insecurity: Not on file   Transportation Needs: Not on file  Physical Activity: Not on file  Stress: Not on file  Social Connections: Not on file  Intimate Partner Violence: Not on file    Family History  Problem Relation Age of Onset   Arthritis Mother    Cancer Father        genetic lung cancer    Past Surgical History:  Procedure Laterality Date   SHOULDER SURGERY  2006   left rotator cuff   urethral stricture surgery  approx 2004    ROS: Review of Systems Negative except as stated above  PHYSICAL EXAM: BP (!) 156/98   Pulse 60   Resp 16   Wt (!) 339 lb 9.6 oz (154 kg)   SpO2 99%   BMI 46.06 kg/m   Wt Readings from Last 3 Encounters:  07/09/21 (!) 339 lb 9.6 oz (154 kg)  06/23/14 (!) 314 lb (142.4 kg)  06/09/14 280 lb 3.3 oz (127.1 kg)  Repeat blood pressure 157/108  Physical Exam   General appearance - alert, well appearing, morbidly obese middle-age African-American male and in no distress Mental status - normal mood, behavior, speech, dress, motor activity, and thought processes Eyes - pupils equal and reactive, extraocular  eye movements intact Nose - normal and patent, no erythema, discharge or polyps Mouth - mucous membranes moist, pharynx normal without lesions Neck - supple, no significant adenopathy Chest - clear to auscultation, no wheezes, rales or rhonchi, symmetric air entry Heart - normal rate, regular rhythm, normal S1, S2, no murmurs, rubs, clicks or gallops Extremities - peripheral pulses normal, no pedal edema, no clubbing or cyanosis Mild gynecomastia CMP Latest Ref Rng & Units 06/10/2014 06/09/2014 06/07/2014  Glucose 70 - 99 mg/dL 87 88 70  BUN 6 - 23 mg/dL 10 13 16   Creatinine 0.50 - 1.35 mg/dL 1.61 0.96  Sodium 137 - 147 mEq/L 141 139 140  Potassium 3.7 - 5.3 mEq/L 4.6 4.1 4.1  Chloride 96 - 112 mEq/L 104 101 100  CO2 19 - 32 mEq/L 26 24 23   Calcium 8.4 - 10.5 mg/dL 9.3 9.6 0.45  Total Protein 6.0 - 8.3 g/dL - 7.9 -  Total Bilirubin 0.3 - 1.2 mg/dL -  ) -  Alkaline Phos 39 - 117 U/L - 123(H) -  AST 0 - 37 U/L - 20 -  ALT 0 - 53 U/L - 35 -   Lipid Panel  No results found for: CHOL, TRIG, HDL, CHOLHDL, VLDL, LDLCALC, LDLDIRECT  CBC    Component Value Date/Time   WBC 7.2 06/23/2014 1659   RBC 4.87 06/23/2014 1659   HGB 14.6 06/23/2014 1659   HCT 41.7 06/23/2014 1659   PLT 497 (H) 06/23/2014 1659   MCV 85.6 06/23/2014 1659   MCH 30.0 06/23/2014 1659   MCHC 35.0 06/23/2014 1659   RDW 14.5 06/23/2014 1659   LYMPHSABS 2.2 06/09/2014 1707   MONOABS 1.1 (H) 06/09/2014 1707   EOSABS 0.4 06/09/2014 1707   BASOSABS 0.0 06/09/2014 1707    ASSESSMENT AND PLAN: 1. Establishing care with new doctor, encounter for   2. Morbid obesity (HCC) Discussed health risks associated with pain obese. Dietary counseling given.  Advised to eliminate sugary drinks from the diet, cut back on portion sizes especially of white carbohydrates, eat more lean white meat instead of red meat and incorporate fresh fruits and vegetables into the diet daily.  Printed information given.  Agreeable to nutrition referral. Commended him on engaging in regular exercise.  Advised him that the goal is to get in about 150 minutes/week total of moderate intensity exercise. - Amb ref to Medical Nutrition Therapy-MNT - CBC - Comprehensive metabolic panel - Lipid panel  3. Elevated blood pressure reading without diagnosis of hypertension DASH diet discussed and encouraged.  No issue with blood pressure in the past but gained weight and current blood pressure readings may reflect true essential hypertension.  He will follow-up in 1 week with the clinical pharmacist for repeat blood pressure check.  Advised him that if still elevated, we will need to put him on blood pressure medication.  4. Need for influenza vaccination Event today.  5. Need for Tdap vaccination Given today.  6. Screening for colon cancer Discussed methods of colon cancer screening.  He prefers  to have the colonoscopy.  He has Medicaid. - Ambulatory referral to Gastroenterology  7. Prostate cancer screening Patient agreeable to screening. - PSA  8. Need for hepatitis C screening test Patient agreeable to screening. - Hepatitis C Antibody   Patient was given the opportunity to ask questions.  Patient verbalized understanding of the plan and was able to repeat key elements of the plan.   Orders Placed This Encounter  Procedures  Flu Vaccine QUAD 35mo+IM (Fluarix, Fluzone & Alfiuria Quad PF)   CBC   Comprehensive metabolic panel   Lipid panel   PSA   Hepatitis C Antibody   Ambulatory referral to Gastroenterology   Amb ref to Medical Nutrition Therapy-MNT      Requested Prescriptions    No prescriptions requested or ordered in this encounter    Return in about 3 months (around 10/09/2021) for Give appt with [facility] in 1 wk for recheck BP and to get Shingrix vaccine.  Jonah Blue, MD, FACP

## 2021-07-10 ENCOUNTER — Telehealth: Payer: Self-pay

## 2021-07-10 LAB — COMPREHENSIVE METABOLIC PANEL
ALT: 19 IU/L (ref 0–44)
AST: 17 IU/L (ref 0–40)
Albumin/Globulin Ratio: 1.9 (ref 1.2–2.2)
Albumin: 4.7 g/dL (ref 3.8–4.9)
Alkaline Phosphatase: 74 IU/L (ref 44–121)
BUN/Creatinine Ratio: 16 (ref 9–20)
BUN: 17 mg/dL (ref 6–24)
Bilirubin Total: 0.4 mg/dL (ref 0.0–1.2)
CO2: 23 mmol/L (ref 20–29)
Calcium: 9.7 mg/dL (ref 8.7–10.2)
Chloride: 104 mmol/L (ref 96–106)
Creatinine, Ser: 1.08 mg/dL (ref 0.76–1.27)
Globulin, Total: 2.5 g/dL (ref 1.5–4.5)
Glucose: 83 mg/dL (ref 70–99)
Potassium: 4 mmol/L (ref 3.5–5.2)
Sodium: 142 mmol/L (ref 134–144)
Total Protein: 7.2 g/dL (ref 6.0–8.5)
eGFR: 81 mL/min/{1.73_m2} (ref >59)

## 2021-07-10 LAB — CBC
Hematocrit: 46.3 % (ref 37.5–51.0)
Hemoglobin: 15.5 g/dL (ref 13.0–17.7)
MCH: 30 pg (ref 26.6–33.0)
MCHC: 33.5 g/dL (ref 31.5–35.7)
MCV: 90 fL (ref 79–97)
Platelets: 199 10*3/uL (ref 150–450)
RBC: 5.16 x10E6/uL (ref 4.14–5.80)
RDW: 12.7 % (ref 11.6–15.4)
WBC: 7.5 10*3/uL (ref 3.4–10.8)

## 2021-07-10 LAB — LIPID PANEL
Chol/HDL Ratio: 4.1 ratio (ref 0.0–5.0)
Cholesterol, Total: 234 mg/dL — ABNORMAL HIGH (ref 100–199)
HDL: 57 mg/dL (ref >39)
LDL Chol Calc (NIH): 157 mg/dL — ABNORMAL HIGH (ref 0–99)
Triglycerides: 114 mg/dL (ref 0–149)
VLDL Cholesterol Cal: 20 mg/dL (ref 5–40)

## 2021-07-10 LAB — HEPATITIS C ANTIBODY: Hep C Virus Ab: <0.1 s/co ratio (ref 0.0–0.9)

## 2021-07-10 LAB — PSA: Prostate Specific Ag, Serum: 1.2 ng/mL (ref 0.0–4.0)

## 2021-07-10 NOTE — Telephone Encounter (Signed)
Contacted pt to go over lab results pt didn't answer lvm   Sent a CRM and forward labs to NT to give pt labs when they call back   

## 2021-07-23 ENCOUNTER — Ambulatory Visit: Payer: Self-pay | Admitting: Pharmacist

## 2021-09-10 ENCOUNTER — Encounter: Payer: Self-pay | Attending: Internal Medicine | Admitting: Dietician

## 2021-09-10 ENCOUNTER — Encounter: Payer: Self-pay | Admitting: Dietician

## 2021-09-10 ENCOUNTER — Other Ambulatory Visit: Payer: Self-pay

## 2021-09-10 NOTE — Patient Instructions (Addendum)
Make it a point to drink 64 oz of water each day!  Begin to lower your sweet tea consumption, switch to unsweetened tea, or choose non-nutritive sweeteners like Stevia or Splenda to sweeten it.  Look into "Divided Portion Plate"  Begin to recognize carbohydrates, proteins, and non-starchy vegetables in your food choices!  Choose LEAN PROTEINS like lean beef, lean pork (chops, loin), skinless poultry, low fat dairy, seafood, and legumes (beans/lentils/black-eyed peas)  Begin to build your meals using the proportions of the Balanced Plate. First, select your carb choice(starches/grains) for the meal.  Make it 25% of your meal Next, select your source of protein to pair with your carb choices.  Make it another 25% of your meal. Finally, complete the remaining half of your meal with a variety of non-starchy vegetables.  Make these the remaining 50% of your meal
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
## 2021-10-01 ENCOUNTER — Ambulatory Visit: Payer: Self-pay | Admitting: Internal Medicine

## 2021-12-24 ENCOUNTER — Ambulatory Visit: Payer: Self-pay | Admitting: Dietician

## 2022-01-22 ENCOUNTER — Ambulatory Visit: Payer: Self-pay | Admitting: Registered"

## 2022-02-15 ENCOUNTER — Emergency Department (HOSPITAL_BASED_OUTPATIENT_CLINIC_OR_DEPARTMENT_OTHER)
Admission: EM | Admit: 2022-02-15 | Discharge: 2022-02-15 | Disposition: A | Discharge status: Home / Self Care | Payer: Self-pay | Attending: Emergency Medicine | Admitting: Emergency Medicine

## 2022-02-15 ENCOUNTER — Other Ambulatory Visit: Payer: Self-pay

## 2022-02-15 ENCOUNTER — Encounter (HOSPITAL_BASED_OUTPATIENT_CLINIC_OR_DEPARTMENT_OTHER): Payer: Self-pay | Admitting: Emergency Medicine

## 2022-02-15 ENCOUNTER — Emergency Department (HOSPITAL_BASED_OUTPATIENT_CLINIC_OR_DEPARTMENT_OTHER): Payer: Self-pay

## 2022-02-15 DIAGNOSIS — M5442 Lumbago with sciatica, left side: Secondary | ICD-10-CM | POA: Insufficient documentation

## 2022-02-15 DIAGNOSIS — M25552 Pain in left hip: Secondary | ICD-10-CM

## 2022-02-15 DIAGNOSIS — M5432 Sciatica, left side: Secondary | ICD-10-CM

## 2022-02-15 DIAGNOSIS — X500XXA Overexertion from strenuous movement or load, initial encounter: Secondary | ICD-10-CM | POA: Insufficient documentation

## 2022-02-15 DIAGNOSIS — S39012A Strain of muscle, fascia and tendon of lower back, initial encounter: Secondary | ICD-10-CM | POA: Insufficient documentation

## 2022-02-15 MED ORDER — IBUPROFEN 800 MG PO TABS: 800.0000 mg | ORAL_TABLET | Freq: Three times a day (TID) | ORAL | 0 refills | Status: DC

## 2022-02-15 MED ORDER — HYDROCODONE-ACETAMINOPHEN 5-325 MG PO TABS: 1.0000 | ORAL_TABLET | Freq: Four times a day (QID) | ORAL | 0 refills | Status: AC | PRN

## 2022-02-15 MED ORDER — CYCLOBENZAPRINE HCL 5 MG PO TABS
5.0000 mg | ORAL_TABLET | Freq: Once | ORAL | Status: AC
  Administered 2022-02-15: 5 mg via ORAL
  Filled 2022-02-15: qty 1

## 2022-02-15 MED ORDER — CYCLOBENZAPRINE HCL 10 MG PO TABS: 10.0000 mg | ORAL_TABLET | Freq: Two times a day (BID) | ORAL | 0 refills | Status: DC | PRN

## 2022-02-15 MED ORDER — LIDOCAINE 5 % EX PTCH
1.0000 | MEDICATED_PATCH | CUTANEOUS | Status: DC
  Administered 2022-02-15: 1 via TRANSDERMAL
  Filled 2022-02-15: qty 1

## 2022-02-15 MED ORDER — LIDOCAINE 5 % EX PTCH: 1.0000 | MEDICATED_PATCH | CUTANEOUS | 0 refills | Status: DC

## 2022-02-15 MED ORDER — OXYCODONE-ACETAMINOPHEN 5-325 MG PO TABS
1.0000 | ORAL_TABLET | ORAL | Status: DC | PRN
  Administered 2022-02-15: 1 via ORAL
  Filled 2022-02-15: qty 1

## 2022-02-15 MED ORDER — KETOROLAC TROMETHAMINE 60 MG/2ML IM SOLN
60.0000 mg | Freq: Once | INTRAMUSCULAR | Status: AC
  Administered 2022-02-15: 60 mg via INTRAMUSCULAR
  Filled 2022-02-15: qty 2

## 2022-02-15 NOTE — ED Triage Notes (Signed)
Lower left back and hip pain. X 2 weeks has been getting worse. Has Hx of l2-l4 compression but hip is worse today.

## 2022-02-15 NOTE — ED Provider Notes (Signed)
[facility] EMERGENCY DEPARTMENT Provider Note   CSN: [id number] Arrival date & time: 02/15/22  1452     History  Chief Complaint  Patient presents with   Hip Pain    Edward Zuniga is a 56 y.o. male.   Hip Pain   56 year old male presenting to the emergency department with left lower back pain for the past 2 weeks.  The patient states he has a history of L2-L4 spinal cord compression but endorses worsening pain today.  He states that he was lifting heavy items within the past week and injured his back.  He endorses pain that is worse with range of motion.  He additionally endorses radicular pain down his left leg.  He denies any numbness or weakness.  He endorses some tingling in his left lower extremity.  He denies any urinary or fecal incontinence.  He denies any saddle anesthesia.  He denies any fever or chills.  He denies any recent falls or other trauma.  Home Medications Prior to Admission medications   Medication Sig Start Date End Date Taking? Authorizing Provider  cyclobenzaprine (FLEXERIL) 10 MG tablet Take 1 tablet (10 mg total) by mouth 2 (two) times daily as needed for muscle spasms. 02/15/22  Yes Regan Lemming, MD  HYDROcodone-acetaminophen (NORCO/VICODIN) 5-325 MG tablet Take 1 tablet by mouth every 6 (six) hours as needed for up to 3 days. 02/15/22 02/18/22 Yes Regan Lemming, MD  ibuprofen (ADVIL) 800 MG tablet Take 1 tablet (800 mg total) by mouth 3 (three) times daily. 02/15/22  Yes Regan Lemming, MD  lidocaine (LIDODERM) 5 % Place 1 patch onto the skin daily. Remove & Discard patch within 12 hours or as directed by MD 02/15/22  Yes Regan Lemming, MD      Allergies    Other    Review of Systems   Review of Systems  All other systems reviewed and are negative.  Physical Exam Updated Vital Signs BP 124/89   Pulse 86   Resp 20   Ht 6' (1.829 m)   Wt 136.1 kg   SpO2 97%   BMI 40.69 kg/m  Physical Exam Vitals and nursing note reviewed.   Constitutional:      General: He is not in acute distress.    Appearance: He is well-developed.  HENT:     Head: Normocephalic and atraumatic.  Eyes:     Conjunctiva/sclera: Conjunctivae normal.  Cardiovascular:     Rate and Rhythm: Normal rate and regular rhythm.  Pulmonary:     Effort: Pulmonary effort is normal. No respiratory distress.     Breath sounds: Normal breath sounds.  Abdominal:     Palpations: Abdomen is soft.     Tenderness: There is no abdominal tenderness.  Musculoskeletal:        General: No swelling.     Cervical back: Neck supple.     Comments: Positive straight leg raise test on the left.  No midline tenderness to palpation of the lumbar spine.  Paraspinal muscular tenderness on the left.  Skin:    General: Skin is warm and dry.     Capillary Refill: Capillary refill takes less than 2 seconds.  Neurological:     Mental Status: He is alert.     Comments: 5 out of 5 strength in the bilateral lower extremities with intact sensation to light touch.  5 out of 5 strength in the upper extremities bilaterally.  Psychiatric:        Mood and Affect:  Mood normal.    ED Results / Procedures / Treatments   Labs (all labs ordered are listed, but only abnormal results are displayed) Labs Reviewed - No data to display  EKG None  Radiology DG Hip Unilat With Pelvis 2-3 Views Left  Result Date: 02/15/2022 CLINICAL DATA:  Acute onset of left hip pain for 1 week status post lifting a TV. Chronic lower back pain. EXAM: DG HIP (WITH OR WITHOUT PELVIS) 2-3V LEFT COMPARISON:  None Available. FINDINGS: Normal bone mineralization. The bilateral sacroiliac, bilateral femoroacetabular, and pubic symphysis joint spaces are maintained. Mild chronic enthesopathic change at the abductor tendon insertions on the greater trochanter and iliopsoas tendon insertion on the lesser trochanter of the proximal left femur. No acute fracture or dislocation. IMPRESSION: No significant left hip  osteoarthritis. Electronically Signed   By: Yvonne Kendall M.D.   On: 02/15/2022 15:35    Procedures Procedures    Medications Ordered in ED Medications  oxyCODONE-acetaminophen (PERCOCET/ROXICET) 5-325 MG per tablet 1 tablet (1 tablet Oral Given 02/15/22 1512)  lidocaine (LIDODERM) 5 % 1 patch (1 patch Transdermal Patch Applied 02/15/22 1702)  ketorolac (TORADOL) injection 60 mg (60 mg Intramuscular Given 02/15/22 1658)  cyclobenzaprine (FLEXERIL) tablet 5 mg (5 mg Oral Given 02/15/22 1657)    ED Course/ Medical Decision Making/ A&P                           Medical Decision Making Amount and/or Complexity of Data Reviewed Radiology: ordered.  Risk Prescription drug management.   56 year old male presenting to the emergency department with left lower back pain for the past 2 weeks.  The patient states he has a history of L2-L4 spinal cord compression but endorses worsening pain today.  He states that he was lifting heavy items within the past week and injured his back.  He endorses pain that is worse with range of motion.  He additionally endorses radicular pain down his left leg.  He denies any numbness or weakness.  He endorses some tingling in his left lower extremity.  He denies any urinary or fecal incontinence.  He denies any saddle anesthesia.  He denies any fever or chills.  He denies any recent falls or other trauma.  The patient is able to ambulate and is hemodynamically stable. There are no red flag symptoms. Specifically, he denies: -Being on an anticoagulant or blood thinner -Using IV drugs -Having a history of AAA -Having saddle anesthesia -Having urinary or fecal incontinence -Having any recent falls or trauma  Differential Diagnoses: I do not think that [name] is experiencing cauda equina syndrome, abdominal aortic aneurysm, epidural abscess, aortic dissection, spinal hematoma, nephrolithiasis, spinal metastasis, discitis, or an acute fracture.  The patient  is able to ambulate.  While in the ED, I provided the patient with: Medications  oxyCODONE-acetaminophen (PERCOCET/ROXICET) 5-325 MG per tablet 1 tablet (1 tablet Oral Given 02/15/22 1512)  lidocaine (LIDODERM) 5 % 1 patch (1 patch Transdermal Patch Applied 02/15/22 1702)  ketorolac (TORADOL) injection 60 mg (60 mg Intramuscular Given 02/15/22 1658)  cyclobenzaprine (FLEXERIL) tablet 5 mg (5 mg Oral Given 02/15/22 1657)    On reassessment, the patient had improvement in symptoms. This presentation is most consistent with lumbar strain, lumbosacral radiculopathy likely from degenerative disc disease/herniated disc.  Given the patient's reassuring presentation, I believe that he is safe for discharge.  We will treat with high-dose NSAIDs, Flexeril, lidocaine patch, prescribe short course of Norco.  I provided ED return precautions, specifically for the symptoms which are most concerning (e.g., saddle anesthesia, urinary or bowel incontinence or retention, changing or worsening pain), which would necessitate immediate return.  I encouraged the patient to followup with their PCP and spine surgery as needed.   Final Clinical Impression(s) / ED Diagnoses Final diagnoses:  Sciatica of left side  Left hip pain  Strain of lumbar region, initial encounter    Rx / DC Orders ED Discharge Orders          Ordered    ibuprofen (ADVIL) 800 MG tablet  3 times daily        02/15/22 1747    cyclobenzaprine (FLEXERIL) 10 MG tablet  2 times daily PRN        02/15/22 1747    lidocaine (LIDODERM) 5 %  Every 24 hours        02/15/22 1747    HYDROcodone-acetaminophen (NORCO/VICODIN) 5-325 MG tablet  Every 6 hours PRN        02/15/22 1747    Ambulatory referral to Neurosurgery        02/15/22 1748              Regan Lemming, MD 02/15/22 1752

## 2022-02-15 NOTE — ED Notes (Signed)
ED Provider at bedside. 

## 2022-02-15 NOTE — Discharge Instructions (Addendum)
Your symptoms are consistent with sciatica likely from a ruptured or bulging disc.  Your symptoms are also consistent with likely musculoskeletal strain in the lower lumbar region.  Recommend follow-up with spine in clinic.  Your x-ray imaging of the hip did not show degenerative osteoarthritis of the hip joint.

## 2022-02-19 ENCOUNTER — Ambulatory Visit: Payer: Self-pay | Admitting: Dietician

## 2022-03-06 ENCOUNTER — Emergency Department (HOSPITAL_BASED_OUTPATIENT_CLINIC_OR_DEPARTMENT_OTHER)
Admission: EM | Admit: 2022-03-06 | Discharge: 2022-03-06 | Disposition: A | Discharge status: Home / Self Care | Payer: Self-pay | Attending: Emergency Medicine | Admitting: Emergency Medicine

## 2022-03-06 ENCOUNTER — Other Ambulatory Visit (HOSPITAL_BASED_OUTPATIENT_CLINIC_OR_DEPARTMENT_OTHER): Payer: Self-pay

## 2022-03-06 ENCOUNTER — Encounter (HOSPITAL_BASED_OUTPATIENT_CLINIC_OR_DEPARTMENT_OTHER): Payer: Self-pay

## 2022-03-06 DIAGNOSIS — M5432 Sciatica, left side: Secondary | ICD-10-CM | POA: Insufficient documentation

## 2022-03-06 MED ORDER — KETOROLAC TROMETHAMINE 60 MG/2ML IM SOLN
60.0000 mg | Freq: Once | INTRAMUSCULAR | Status: AC
  Administered 2022-03-06: 60 mg via INTRAMUSCULAR
  Filled 2022-03-06: qty 2

## 2022-03-06 MED ORDER — CYCLOBENZAPRINE HCL 10 MG PO TABS
10.0000 mg | ORAL_TABLET | Freq: Two times a day (BID) | ORAL | 0 refills | Status: DC | PRN
Start: 2022-03-06 — End: 2022-03-20
  Filled 2022-03-06: qty 20, 10d supply, fill #0

## 2022-03-06 MED ORDER — ACETAMINOPHEN 500 MG PO TABS
500.0000 mg | ORAL_TABLET | Freq: Four times a day (QID) | ORAL | 0 refills | Status: DC | PRN
  Filled 2022-03-06: qty 100, 25d supply, fill #0

## 2022-03-06 MED ORDER — IBUPROFEN 600 MG PO TABS
600.0000 mg | ORAL_TABLET | Freq: Four times a day (QID) | ORAL | 0 refills | Status: DC | PRN
  Filled 2022-03-06: qty 30, 8d supply, fill #0
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
## 2022-03-06 NOTE — ED Triage Notes (Signed)
C/o left lumbar back pain for several weeks, ran out of meds

## 2022-03-06 NOTE — Discharge Instructions (Addendum)
We saw in the ER for back pain.  Clinically, it appears that you have sciatica.  Take the medications that are prescribed.  As discussed, the mainstay of treatment will be anti-inflammatory medication, stretching and perhaps physical therapy if your symptoms are not improving.  Consider calling [facility] for a follow-up.  It might take up to 2 months to see them.

## 2022-03-12 ENCOUNTER — Ambulatory Visit: Payer: Self-pay | Admitting: Skilled Nursing Facility1

## 2022-03-20 ENCOUNTER — Other Ambulatory Visit (HOSPITAL_BASED_OUTPATIENT_CLINIC_OR_DEPARTMENT_OTHER): Payer: Self-pay

## 2022-03-20 ENCOUNTER — Emergency Department (HOSPITAL_BASED_OUTPATIENT_CLINIC_OR_DEPARTMENT_OTHER)
Admission: EM | Admit: 2022-03-20 | Discharge: 2022-03-20 | Disposition: A | Discharge status: Home / Self Care | Payer: Self-pay | Attending: Emergency Medicine | Admitting: Emergency Medicine

## 2022-03-20 ENCOUNTER — Encounter (HOSPITAL_BASED_OUTPATIENT_CLINIC_OR_DEPARTMENT_OTHER): Payer: Self-pay | Admitting: Emergency Medicine

## 2022-03-20 ENCOUNTER — Other Ambulatory Visit: Payer: Self-pay

## 2022-03-20 DIAGNOSIS — M5440 Lumbago with sciatica, unspecified side: Secondary | ICD-10-CM

## 2022-03-20 DIAGNOSIS — X509XXA Other and unspecified overexertion or strenuous movements or postures, initial encounter: Secondary | ICD-10-CM | POA: Insufficient documentation

## 2022-03-20 DIAGNOSIS — G8911 Acute pain due to trauma: Secondary | ICD-10-CM | POA: Insufficient documentation

## 2022-03-20 DIAGNOSIS — M5442 Lumbago with sciatica, left side: Secondary | ICD-10-CM | POA: Insufficient documentation

## 2022-03-20 MED ORDER — METHYLPREDNISOLONE 4 MG PO TBPK
ORAL_TABLET | ORAL | 0 refills | Status: DC
  Filled 2022-03-20: qty 21, 6d supply, fill #0

## 2022-03-20 MED ORDER — KETOROLAC TROMETHAMINE 60 MG/2ML IM SOLN
60.0000 mg | Freq: Once | INTRAMUSCULAR | Status: AC
Start: 2022-03-20 — End: 2022-03-20
  Administered 2022-03-20: 60 mg via INTRAMUSCULAR
  Filled 2022-03-20: qty 2

## 2022-03-20 MED ORDER — CYCLOBENZAPRINE HCL 10 MG PO TABS
10.0000 mg | ORAL_TABLET | Freq: Two times a day (BID) | ORAL | 0 refills | Status: DC | PRN
  Filled 2022-03-20: qty 20, 10d supply, fill #0

## 2022-03-20 MED ORDER — OXYCODONE HCL 5 MG PO TABS
5.0000 mg | ORAL_TABLET | Freq: Four times a day (QID) | ORAL | 0 refills | Status: DC | PRN
  Filled 2022-03-20: qty 5, 2d supply, fill #0

## 2022-03-20 NOTE — ED Triage Notes (Signed)
Pt was moving furniture 3 weeks ago, aggravating old back injury, hasnt gotten better.pt was treated with pain meds and muscle relaxer, ran out 1 week ago, pain is not better

## 2022-03-20 NOTE — ED Provider Notes (Signed)
[facility] EMERGENCY DEPT Provider Note   CSN: [id number] Arrival date & time: 03/20/22  1327     History  Chief Complaint  Patient presents with   Back Pain    Edward Zuniga is a 56 y.o. male.  Patient here with low back pain.  Radiates into his left buttocks.  Denies any loss of bowel or bladder.  States that muscle relaxants helped in the past.  Denies any nausea, vomiting, abdominal pain, weakness, numbness, trauma.  Denies any chest pain.  Does not have a primary care doctor at this time.  Denies any fevers or chills.  Activity makes it worse at times.  The history is provided by the patient.       Home Medications Prior to Admission medications   Medication Sig Start Date End Date Taking? Authorizing Provider  methylPREDNISolone (MEDROL DOSEPAK) 4 MG TBPK tablet Follow package insert 03/20/22  Yes Audryanna Zurita, DO  oxyCODONE (ROXICODONE) 5 MG immediate release tablet Take 1 tablet (5 mg total) by mouth every 6 (six) hours as needed for up to 5 doses for severe pain or breakthrough pain. 03/20/22  Yes Taneia Mealor, DO  acetaminophen (TYLENOL) 500 MG tablet Take 1 tablet (500 mg total) by mouth every 6 (six) hours as needed. 03/06/22   Derwood Kaplan, MD  cyclobenzaprine (FLEXERIL) 10 MG tablet Take 1 tablet (10 mg total) by mouth 2 (two) times daily as needed for muscle spasms. 03/20/22   Nola Botkins, DO  ibuprofen (ADVIL) 600 MG tablet Take 1 tablet (600 mg total) by mouth every 6 (six) hours as needed. 03/06/22   Derwood Kaplan, MD  lidocaine (LIDODERM) 5 % Place 1 patch onto the skin daily. Remove & Discard patch within 12 hours or as directed by MD 02/15/22   Ernie Avena, MD      Allergies    Other    Review of Systems   Review of Systems  Physical Exam Updated Vital Signs BP (!) 141/90 (BP Location: Left Arm)   Pulse 90   Temp 97.9 F (36.6 C)   Resp 16   SpO2 96%  Physical Exam Vitals and nursing note reviewed.  Constitutional:       General: He is not in acute distress.    Appearance: He is well-developed. He is not ill-appearing.  HENT:     Head: Normocephalic and atraumatic.     Nose: Nose normal.     Mouth/Throat:     Mouth: Mucous membranes are moist.  Eyes:     Extraocular Movements: Extraocular movements intact.     Conjunctiva/sclera: Conjunctivae normal.     Pupils: Pupils are equal, round, and reactive to light.  Cardiovascular:     Rate and Rhythm: Normal rate and regular rhythm.     Pulses: Normal pulses.     Heart sounds: Normal heart sounds. No murmur heard. Pulmonary:     Effort: Pulmonary effort is normal. No respiratory distress.     Breath sounds: Normal breath sounds.  Abdominal:     Palpations: Abdomen is soft.     Tenderness: There is no abdominal tenderness.  Musculoskeletal:        General: Tenderness present. No swelling.     Cervical back: Neck supple.  Skin:    General: Skin is warm and dry.     Capillary Refill: Capillary refill takes less than 2 seconds.  Neurological:     General: No focal deficit present.     Mental Status: He is  alert and oriented to person, place, and time.     Cranial Nerves: No cranial nerve deficit.     Sensory: No sensory deficit.     Motor: No weakness.     Coordination: Coordination normal.     Comments: 5+ out of 5 strength throughout, normal sensation  Psychiatric:        Mood and Affect: Mood normal.     ED Results / Procedures / Treatments   Labs (all labs ordered are listed, but only abnormal results are displayed) Labs Reviewed - No data to display  EKG None  Radiology No results found.  Procedures Procedures    Medications Ordered in ED Medications  ketorolac (TORADOL) injection 60 mg (has no administration in time range)    ED Course/ Medical Decision Making/ A&P                           Medical Decision Making Risk Prescription drug management.   Edward Zuniga is here with back pain.  No significant medical  history.  No loss of bowel or bladder.  No trauma.  No fever or chills.  Unremarkable vitals.  Tender in the paraspinal muscles on the left as well as the left buttocks.  Muscle relaxants help.  Movement makes it worse.  Differential diagnosis likely muscle spasm, sciatica.  Have no concern for cauda equina or acute spinal cord process at this time.  He is neurovascular neuromuscular intact.  He has good pulses in his lower extremities.  We will give a shot of Toradol.  Will prescribe Medrol Dosepak, Flexeril, Roxicodone for breakthrough pain.  We will have him follow-up with sports medicine.  Discharged in good condition.  Understands return precautions.  This chart was dictated using voice recognition software.  Despite best efforts to proofread,  errors can occur which can change the documentation meaning.         Final Clinical Impression(s) / ED Diagnoses Final diagnoses:  Acute low back pain with sciatica, sciatica laterality unspecified, unspecified back pain laterality    Rx / DC Orders ED Discharge Orders          Ordered    oxyCODONE (ROXICODONE) 5 MG immediate release tablet  Every 6 hours PRN        03/20/22 1445    cyclobenzaprine (FLEXERIL) 10 MG tablet  2 times daily PRN        03/20/22 1445    methylPREDNISolone (MEDROL DOSEPAK) 4 MG TBPK tablet        03/20/22 1445              Vernis Eid, DO 03/20/22 1445

## 2023-02-10 ENCOUNTER — Other Ambulatory Visit: Payer: Self-pay

## 2023-02-10 ENCOUNTER — Emergency Department (HOSPITAL_BASED_OUTPATIENT_CLINIC_OR_DEPARTMENT_OTHER): Payer: Commercial Managed Care - HMO

## 2023-02-10 ENCOUNTER — Encounter (HOSPITAL_BASED_OUTPATIENT_CLINIC_OR_DEPARTMENT_OTHER): Payer: Self-pay

## 2023-02-10 ENCOUNTER — Emergency Department (HOSPITAL_BASED_OUTPATIENT_CLINIC_OR_DEPARTMENT_OTHER)
Admission: EM | Admit: 2023-02-10 | Discharge: 2023-02-10 | Disposition: A | Discharge status: Home / Self Care | Payer: Commercial Managed Care - HMO | Attending: Emergency Medicine | Admitting: Emergency Medicine

## 2023-02-10 DIAGNOSIS — R059 Cough, unspecified: Secondary | ICD-10-CM | POA: Diagnosis present

## 2023-02-10 DIAGNOSIS — R062 Wheezing: Secondary | ICD-10-CM | POA: Diagnosis not present

## 2023-02-10 DIAGNOSIS — Z1152 Encounter for screening for COVID-19: Secondary | ICD-10-CM | POA: Insufficient documentation

## 2023-02-10 DIAGNOSIS — J9801 Acute bronchospasm: Secondary | ICD-10-CM

## 2023-02-10 DIAGNOSIS — J189 Pneumonia, unspecified organism: Secondary | ICD-10-CM

## 2023-02-10 LAB — RESP PANEL BY RT-PCR (RSV, FLU A&B, COVID)  RVPGX2
Influenza A by PCR: NEGATIVE
Influenza B by PCR: NEGATIVE
Resp Syncytial Virus by PCR: NEGATIVE
SARS Coronavirus 2 by RT PCR: NEGATIVE

## 2023-02-10 MED ORDER — AZITHROMYCIN 250 MG PO TABS
500.0000 mg | ORAL_TABLET | Freq: Once | ORAL | Status: AC
  Administered 2023-02-10: 500 mg via ORAL
  Filled 2023-02-10: qty 2

## 2023-02-10 MED ORDER — SODIUM CHLORIDE 0.9 % IV SOLN
1.0000 g | Freq: Once | INTRAVENOUS | Status: AC
  Administered 2023-02-10: 1 g via INTRAVENOUS
  Filled 2023-02-10: qty 10

## 2023-02-10 MED ORDER — IPRATROPIUM-ALBUTEROL 0.5-2.5 (3) MG/3ML IN SOLN
3.0000 mL | Freq: Once | RESPIRATORY_TRACT | Status: AC
Start: 2023-02-10 — End: 2023-02-10
  Administered 2023-02-10: 3 mL via RESPIRATORY_TRACT
  Filled 2023-02-10: qty 3

## 2023-02-10 MED ORDER — AZITHROMYCIN 250 MG PO TABS: 250.0000 mg | ORAL_TABLET | Freq: Every day | ORAL | 0 refills | Status: DC

## 2023-02-10 MED ORDER — GUAIFENESIN 100 MG/5ML PO LIQD
20.0000 mL | Freq: Once | ORAL | Status: AC
  Administered 2023-02-10: 20 mL via ORAL
  Filled 2023-02-10: qty 20

## 2023-02-10 MED ORDER — ALBUTEROL SULFATE HFA 108 (90 BASE) MCG/ACT IN AERS
2.0000 | INHALATION_SPRAY | Freq: Once | RESPIRATORY_TRACT | Status: AC
  Administered 2023-02-10: 2 via RESPIRATORY_TRACT
  Filled 2023-02-10: qty 6.7

## 2023-02-10 MED ORDER — AEROCHAMBER PLUS FLO-VU MISC
1.0000 | Freq: Once | Status: DC
  Filled 2023-02-10: qty 1

## 2023-02-10 MED ORDER — DEXAMETHASONE SODIUM PHOSPHATE 10 MG/ML IJ SOLN
10.0000 mg | Freq: Once | INTRAMUSCULAR | Status: AC
  Administered 2023-02-10: 10 mg via INTRAVENOUS
  Filled 2023-02-10: qty 1

## 2023-02-10 MED ORDER — METHYLPREDNISOLONE 4 MG PO TBPK: ORAL_TABLET | ORAL | 0 refills | Status: DC

## 2023-02-10 MED ORDER — SODIUM CHLORIDE 0.9 % IV BOLUS
1000.0000 mL | Freq: Once | INTRAVENOUS | Status: AC
  Administered 2023-02-10: 1000 mL via INTRAVENOUS

## 2023-02-10 NOTE — Discharge Instructions (Signed)
Use 2 puffs of your inhaler every 4 hours for at least the next 3 to 4 days.  You may then use it up to every 4 hours as needed. Medications I have prescribed as directed and please finish the entire course of antibiotics.  You may use over-the-counter DayQuil or NyQuil for cough suppression and symptom management.  I would also advise using Astepro nasal spray for your nasal congestion.  Contact a health care provider if: You have a fever. You have trouble sleeping because you cannot control your cough with cough medicine. Get help right away if: Your shortness of breath becomes worse. Your chest pain increases. Your sickness becomes worse, especially if you are an older adult or have a weak immune system. You cough up blood. These symptoms may be an emergency. Get help right away. Call 911. Do not wait to see if the symptoms will go away. Do not drive yourself to the hospital.

## 2023-02-10 NOTE — ED Triage Notes (Signed)
Pt reports intermittent dry cough x 2 weeks associated with tiredness, dizziness and burning in his chest onset today at 6pm.

## 2023-02-10 NOTE — Patient Instructions (Signed)
Instructed patient on the proper application of the flutter valve. Patient has bilateral PNA, diminished BS with wheezes, oxygen saturation 97%. Patient demonstrated flutter valve properly X 8 with hard coughing. Patient understood to use flutter valve Q1hr X 10 WA. Patent tolerated well.

## 2023-02-10 NOTE — ED Provider Notes (Signed)
[place] [facility] DEPARTMENT AT [facility] Provider Note   CSN: [id number] Arrival date & time: 02/10/23  2026     History  Chief Complaint  Patient presents with   Cough    Edward Zuniga is a 57 y.o. male.  Who presents emergency department with a chief complaint of coughing and wheezing.  Patient states that he has had nasal congestion and a cough for the past 2 weeks that has been progressively worsening.  Tonight he states that he just could not stop coughing.   He denies fever of or shortness of breath.  No history of asthma, diabetes, CHF   Cough      Home Medications Prior to Admission medications   Medication Sig Start Date End Date Taking? Authorizing Provider  acetaminophen (TYLENOL) 500 MG tablet Take 1 tablet (500 mg total) by mouth every 6 (six) hours as needed. 03/06/22   Derwood Kaplan, MD  cyclobenzaprine (FLEXERIL) 10 MG tablet Take 1 tablet (10 mg total) by mouth 2 (two) times daily as needed for muscle spasms. 03/20/22   Curatolo, Adam, DO  ibuprofen (ADVIL) 600 MG tablet Take 1 tablet (600 mg total) by mouth every 6 (six) hours as needed. 03/06/22   Derwood Kaplan, MD  lidocaine (LIDODERM) 5 % Place 1 patch onto the skin daily. Remove & Discard patch within 12 hours or as directed by MD 02/15/22   Ernie Avena, MD  methylPREDNISolone (MEDROL DOSEPAK) 4 MG TBPK tablet Follow package insert 03/20/22   Curatolo, Adam, DO  oxyCODONE (ROXICODONE) 5 MG immediate release tablet Take 1 tablet (5 mg total) by mouth every 6 (six) hours as needed for up to 5 doses for severe pain or breakthrough pain. 03/20/22   Virgina Norfolk, DO      Allergies    Other    Review of Systems   Review of Systems  Respiratory:  Positive for cough.     Physical Exam Updated Vital Signs BP (!) 162/106 (BP Location: Left Arm)   Pulse 92   Temp 98.6 F (37 C)   Resp 20   Ht 6' (1.829 m)   Wt 136.1 kg   SpO2 97%   BMI 40.69 kg/m  Physical Exam Vitals and nursing  note reviewed.  Constitutional:      General: He is not in acute distress.    Appearance: He is well-developed. He is obese. He is not diaphoretic.  HENT:     Head: Normocephalic and atraumatic.  Eyes:     General: No scleral icterus.    Conjunctiva/sclera: Conjunctivae normal.  Cardiovascular:     Rate and Rhythm: Normal rate and regular rhythm.     Heart sounds: Normal heart sounds.  Pulmonary:     Effort: Pulmonary effort is normal. No respiratory distress.     Breath sounds: Wheezing present.  Abdominal:     Palpations: Abdomen is soft.     Tenderness: There is no abdominal tenderness.  Musculoskeletal:     Cervical back: Normal range of motion and neck supple.  Skin:    General: Skin is warm and dry.  Neurological:     Mental Status: He is alert.  Psychiatric:        Behavior: Behavior normal.     ED Results / Procedures / Treatments   Labs (all labs ordered are listed, but only abnormal results are displayed) Labs Reviewed  RESP PANEL BY RT-PCR (RSV, FLU A&B, COVID)  [id number]    EKG EKG Interpretation  Date/Time:  Tuesday Feb 10 2023 20:39:38 EDT Ventricular Rate:  89 PR Interval:  177 QRS Duration: 94 QT Interval:  323 QTC Calculation: 393 R Axis:   74 Text Interpretation: Sinus rhythm LAE, consider biatrial enlargement Nonspecific repol abnormality, inferior leads Borderline ST elevation, anterior leads No significant change since last tracing Confirmed by Melene Plan [phone]) on 02/10/2023 8:49:11 PM  Radiology DG Chest 2 View  Result Date: 02/10/2023 CLINICAL DATA:  Cough EXAM: CHEST - 2 VIEW COMPARISON:  02/27/2008 FINDINGS: Patchy lower lung opacities. No pleural effusion. Normal cardiac size. No pneumothorax. IMPRESSION: Patchy lower lung opacities, suspect for multifocal pneumonia. Radiographic follow-up to resolution recommended. Electronically Signed   By: Jasmine Pang M.D.   On: 02/10/2023 20:50    Procedures Procedures    Medications Ordered in  ED Medications  cefTRIAXone (ROCEPHIN) 1 g in sodium chloride 0.9 % 100 mL IVPB (1 g Intravenous New Bag/Given 02/10/23 2158)  azithromycin (ZITHROMAX) tablet 500 mg (500 mg Oral Given 02/10/23 2159)  dexamethasone (DECADRON) injection 10 mg (10 mg Intravenous Given 02/10/23 2159)  sodium chloride 0.9 % bolus 1,000 mL (1,000 mLs Intravenous New Bag/Given 02/10/23 2153)  ipratropium-albuterol (DUONEB) 0.5-2.5 (3) MG/3ML nebulizer solution 3 mL (3 mLs Nebulization Given 02/10/23 2155)    ED Course/ Medical Decision Making/ A&P                             Medical Decision Making Risk OTC drugs. Prescription drug management.    57 year old male who presents emergency department chief complaint of cough. Differential diagnosis for emergent cause of cough includes but is not limited to upper respiratory infection, lower respiratory infection, allergies, asthma, irritants, foreign body, medications such as ACE inhibitors, reflux, asthma, CHF, lung cancer, interstitial lung disease, psychiatric causes, postnasal drip and postinfectious bronchospasm. I ordered and reviewed labs which included negative respiratory panel for COVID or influenza or RSV. I visualized and interpreted two-view chest x-ray which showed patchy lower lung opacities suspicious for multifocal pneumonia. Patient treated in the emergency department with albuterol, ipratropium, Decadron, antibiotics including Rocephin and azithromycin.  Will discharge with azithromycin.  He was taught how to use an inhaler and will be discharged with the inhaler along with oral steroids.  Patient given supportive care with flutter valve.  He has normal oxygen saturations and appears appropriate for discharge at this time.        Final Clinical Impression(s) / ED Diagnoses Final diagnoses:  None    Rx / DC Orders ED Discharge Orders     None         Arthor Captain, PA-C 02/10/23 2310    Melene Plan, DO 02/10/23 2339

## 2023-02-19 ENCOUNTER — Encounter (HOSPITAL_BASED_OUTPATIENT_CLINIC_OR_DEPARTMENT_OTHER): Payer: Self-pay

## 2023-02-19 ENCOUNTER — Other Ambulatory Visit: Payer: Self-pay

## 2023-02-19 ENCOUNTER — Emergency Department (HOSPITAL_BASED_OUTPATIENT_CLINIC_OR_DEPARTMENT_OTHER)
Admission: EM | Admit: 2023-02-19 | Discharge: 2023-02-19 | Disposition: A | Discharge status: Home / Self Care | Payer: Commercial Managed Care - HMO | Attending: Emergency Medicine | Admitting: Emergency Medicine

## 2023-02-19 ENCOUNTER — Emergency Department (HOSPITAL_BASED_OUTPATIENT_CLINIC_OR_DEPARTMENT_OTHER): Payer: Commercial Managed Care - HMO

## 2023-02-19 DIAGNOSIS — R053 Chronic cough: Secondary | ICD-10-CM | POA: Insufficient documentation

## 2023-02-19 DIAGNOSIS — R059 Cough, unspecified: Secondary | ICD-10-CM | POA: Diagnosis present

## 2023-02-19 MED ORDER — HYDROCOD POLI-CHLORPHE POLI ER 10-8 MG/5ML PO SUER
5.0000 mL | Freq: Once | ORAL | Status: AC
  Administered 2023-02-19: 5 mL via ORAL
  Filled 2023-02-19: qty 5

## 2023-02-19 MED ORDER — ALBUTEROL SULFATE HFA 108 (90 BASE) MCG/ACT IN AERS: 2.0000 | INHALATION_SPRAY | RESPIRATORY_TRACT | Status: DC | PRN

## 2023-02-19 MED ORDER — HYDROCOD POLI-CHLORPHE POLI ER 10-8 MG/5ML PO SUER: 5.0000 mL | Freq: Two times a day (BID) | ORAL | 0 refills | Status: AC | PRN

## 2023-02-19 NOTE — ED Notes (Signed)
D/c paperwork reviewed with pt, including prescriptions and follow up care.  All questions and/or concerns addressed at time of d/c.  No further needs expressed. . Pt verbalized understanding, Ambulatory without assistance to ED exit, NAD.   

## 2023-02-19 NOTE — ED Provider Notes (Signed)
[facility] Provider Note: Lowella Dell, MD, FACEP  CSN: [id number] MRN: [id number] ARRIVAL: 02/19/23 at 2245 ROOM: [facility]09/[facility]09   CHIEF COMPLAINT  Cough   HISTORY OF PRESENT ILLNESS  02/19/23 11:12 PM Edward Zuniga is a 57 y.o. male who developed a respiratory illness 2 weeks ago.  Specifically he has had cough and shortness of breath.  He was diagnosed with multifocal pneumonia on 02/10/2023 and treated with IV Rocephin, Zithromax and dexamethasone in the ED and discharged on Zithromax and methylprednisolone.  He was given an albuterol inhaler and AeroChamber as well.  He is here because of persistent nonproductive cough.  He has been taking NyQuil and continues to use his albuterol inhaler without relief of the cough.  He is not having a fever.  He is not having chest pain.   Past Medical History:  Diagnosis Date   Cellulitis     Past Surgical History:  Procedure Laterality Date   SHOULDER SURGERY  2006   left rotator cuff   urethral stricture surgery  approx 2004    Family History  Problem Relation Age of Onset   Arthritis Mother    Cancer Father        genetic lung cancer    Social History   Tobacco Use   Smoking status: Never   Smokeless tobacco: Never  Vaping Use   Vaping Use: Never used  Substance Use Topics   Alcohol use: No    Comment: occ   Drug use: No    Prior to Admission medications   Medication Sig Start Date End Date Taking? Authorizing Provider  acetaminophen (TYLENOL) 500 MG tablet Take 1 tablet (500 mg total) by mouth every 6 (six) hours as needed. 03/06/22   Derwood Kaplan, MD  azithromycin (ZITHROMAX) 250 MG tablet Take 1 tablet (250 mg total) by mouth daily. Take first 2 tablets together, then 1 every day until finished. 02/10/23   Arthor Captain, PA-C  cyclobenzaprine (FLEXERIL) 10 MG tablet Take 1 tablet (10 mg total) by mouth 2 (two) times daily as needed for muscle spasms. 03/20/22   Curatolo, Adam, DO  ibuprofen (ADVIL) 600  MG tablet Take 1 tablet (600 mg total) by mouth every 6 (six) hours as needed. 03/06/22   Derwood Kaplan, MD  lidocaine (LIDODERM) 5 % Place 1 patch onto the skin daily. Remove & Discard patch within 12 hours or as directed by MD 02/15/22   Ernie Avena, MD  methylPREDNISolone (MEDROL DOSEPAK) 4 MG TBPK tablet Follow package insert 03/20/22   Curatolo, Adam, DO  methylPREDNISolone (MEDROL DOSEPAK) 4 MG TBPK tablet Use as directed 02/10/23   Arthor Captain, PA-C  oxyCODONE (ROXICODONE) 5 MG immediate release tablet Take 1 tablet (5 mg total) by mouth every 6 (six) hours as needed for up to 5 doses for severe pain or breakthrough pain. 03/20/22   Curatolo, Adam, DO    Allergies Other   REVIEW OF SYSTEMS  Negative except as noted here or in the History of Present Illness.   PHYSICAL EXAMINATION  Initial Vital Signs Blood pressure (!) 140/82, pulse 89, temperature (!) 97.5 F (36.4 C), temperature source Oral, resp. rate 16, height 6' (1.829 m), weight 136.1 kg, SpO2 98 %.  Examination General: Well-developed, well-nourished male in no acute distress; appearance consistent with age of record HENT: normocephalic; atraumatic Eyes: Normal appearance Neck: supple Heart: regular rate and rhythm Lungs: clear to auscultation bilaterally; no wheezes; no rales; no rhonchi Abdomen: soft; nondistended; nontender; bowel sounds  present Extremities: No deformity; full range of motion Neurologic: Awake, alert and oriented; motor function intact in all extremities and symmetric; no facial droop Skin: Warm and dry Psychiatric: Normal mood and affect   RESULTS  Summary of this visit's results, reviewed and interpreted by myself:   EKG Interpretation  Date/Time:    Ventricular Rate:    PR Interval:    QRS Duration:   QT Interval:    QTC Calculation:   R Axis:     Text Interpretation:         Laboratory Studies: No results found for this or any previous visit (from the past 24  hour(s)). Imaging Studies: DG Chest 2 View  Result Date: 02/19/2023 CLINICAL DATA:  Shortness of breath.  Cough for 2 weeks. EXAM: CHEST - 2 VIEW COMPARISON:  None Available. FINDINGS: Heart size is normal. Previously seen airspace opacities in the lower lobes bilaterally have resolved. No new airspace disease is present. Lung volumes are low. The visualized soft tissues and bony. IMPRESSION: 1. Interval resolution of bilateral lower lobe airspace disease. 2. Low lung volumes. Electronically Signed   By: Marin Roberts M.D.   On: 02/19/2023 23:06    ED COURSE and MDM  Nursing notes, initial and subsequent vitals signs, including pulse oximetry, reviewed and interpreted by myself.  Vitals:   02/19/23 2249 02/19/23 2251  BP:  (!) 140/82  Pulse:  89  Resp:  16  Temp:  (!) 97.5 F (36.4 C)  TempSrc:  Oral  SpO2:  98%  Weight: 136.1 kg   Height: 6' (1.829 m)    Medications  chlorpheniramine-HYDROcodone (TUSSIONEX) 10-8 MG/5ML suspension 5 mL (has no administration in time range)   The patient's chest x-ray shows resolution of his bilateral lower lobe pneumonia.  I do not believe additional antibiotics are required at this point.  He has completed his course of steroids.  His lungs are clear and his air movement is excellent on my examination.  I do not believe additional steroids are indicated.  He was advised to continue using his inhaler and we will trial him on Tussionex   PROCEDURES  Procedures   ED DIAGNOSES     ICD-10-CM   1. Persistent cough  R05.3          Lamonte Hartt, Jonny Ruiz, MD 02/19/23 2326

## 2023-02-19 NOTE — ED Triage Notes (Signed)
Pt reports congested cough x2 weeks. Pt recently dx with pneumonia and has finished his abx. Pt still having symptoms.

## 2023-07-28 IMAGING — CR DG HIP (WITH OR WITHOUT PELVIS) 2-3V*L*
3 series · 3 of 3 positions shown · non-contrast
Comparison: None Available.

CLINICAL DATA: Acute onset of left hip pain for 1 week status post
lifting a TV. Chronic lower back pain.

EXAM:
DG HIP (WITH OR WITHOUT PELVIS) 2-3V LEFT

[t pelvis a.p.]
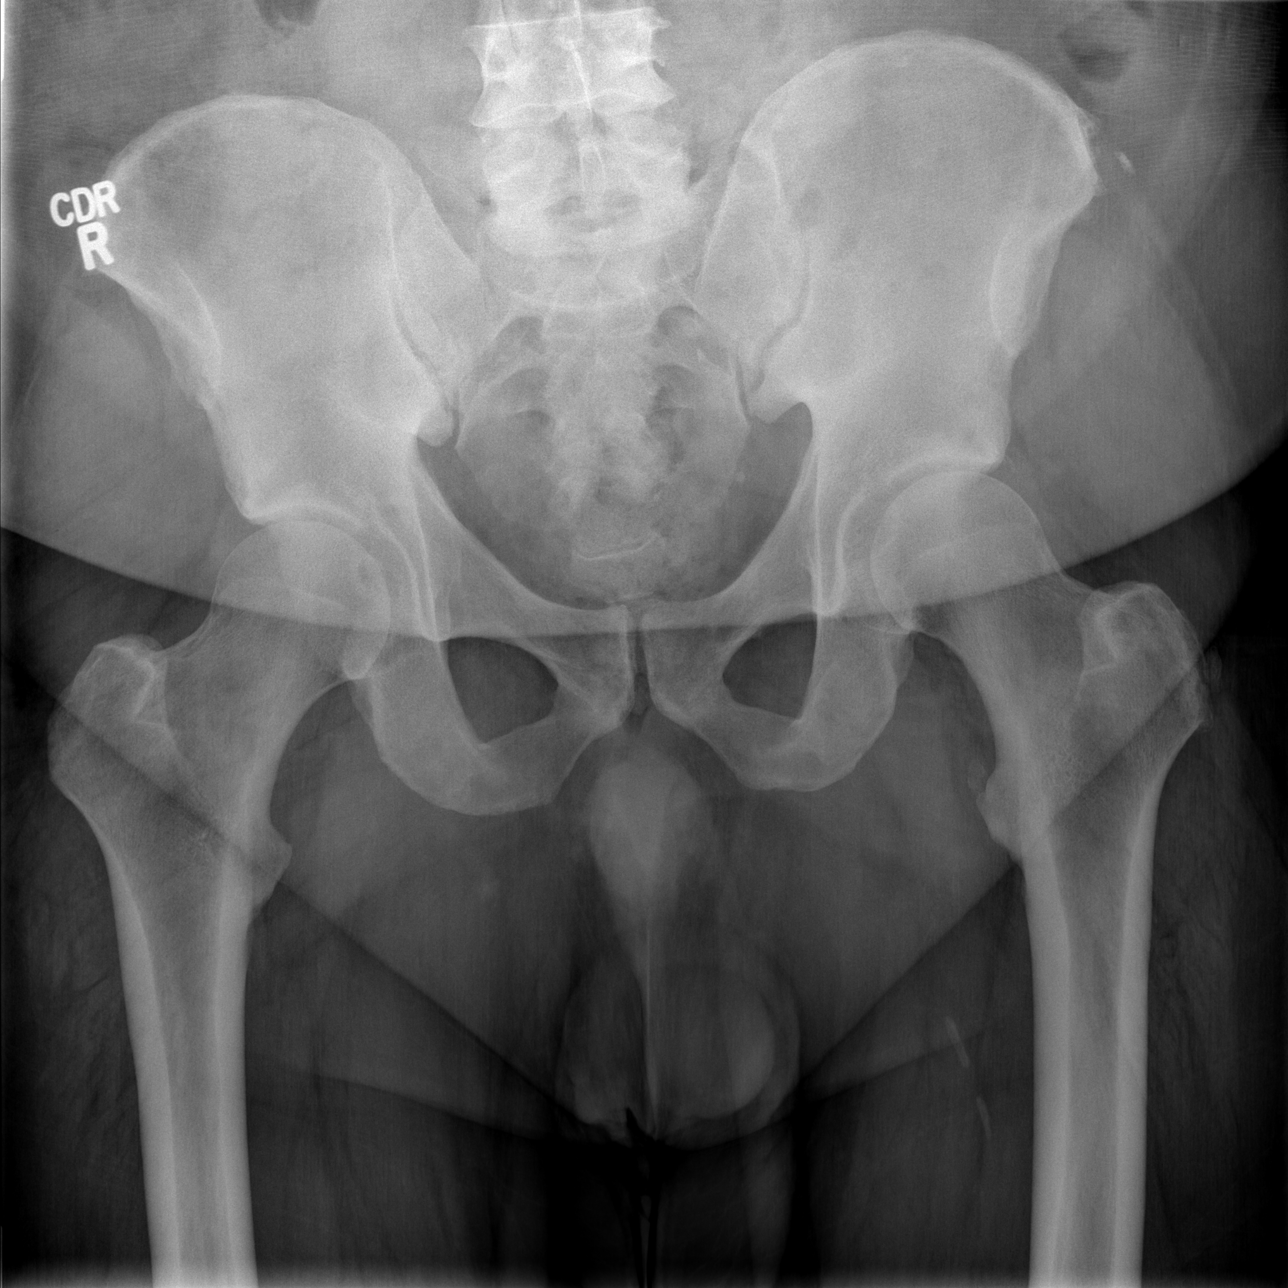

[t hip ap left]
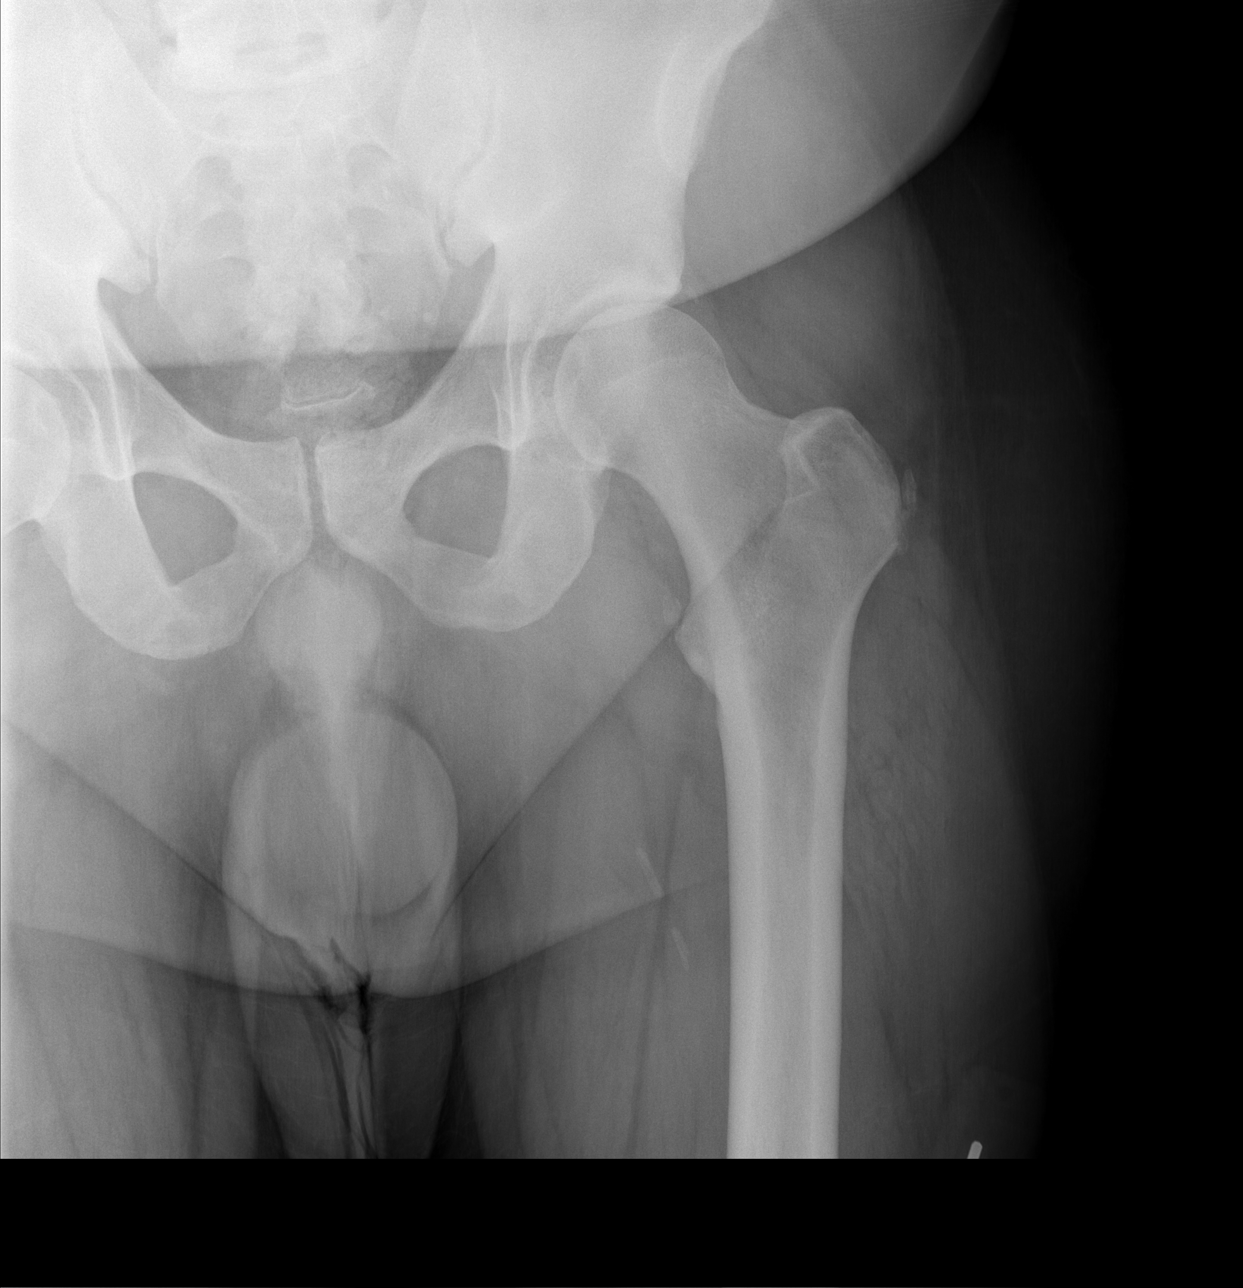

[t hip frog leg left]
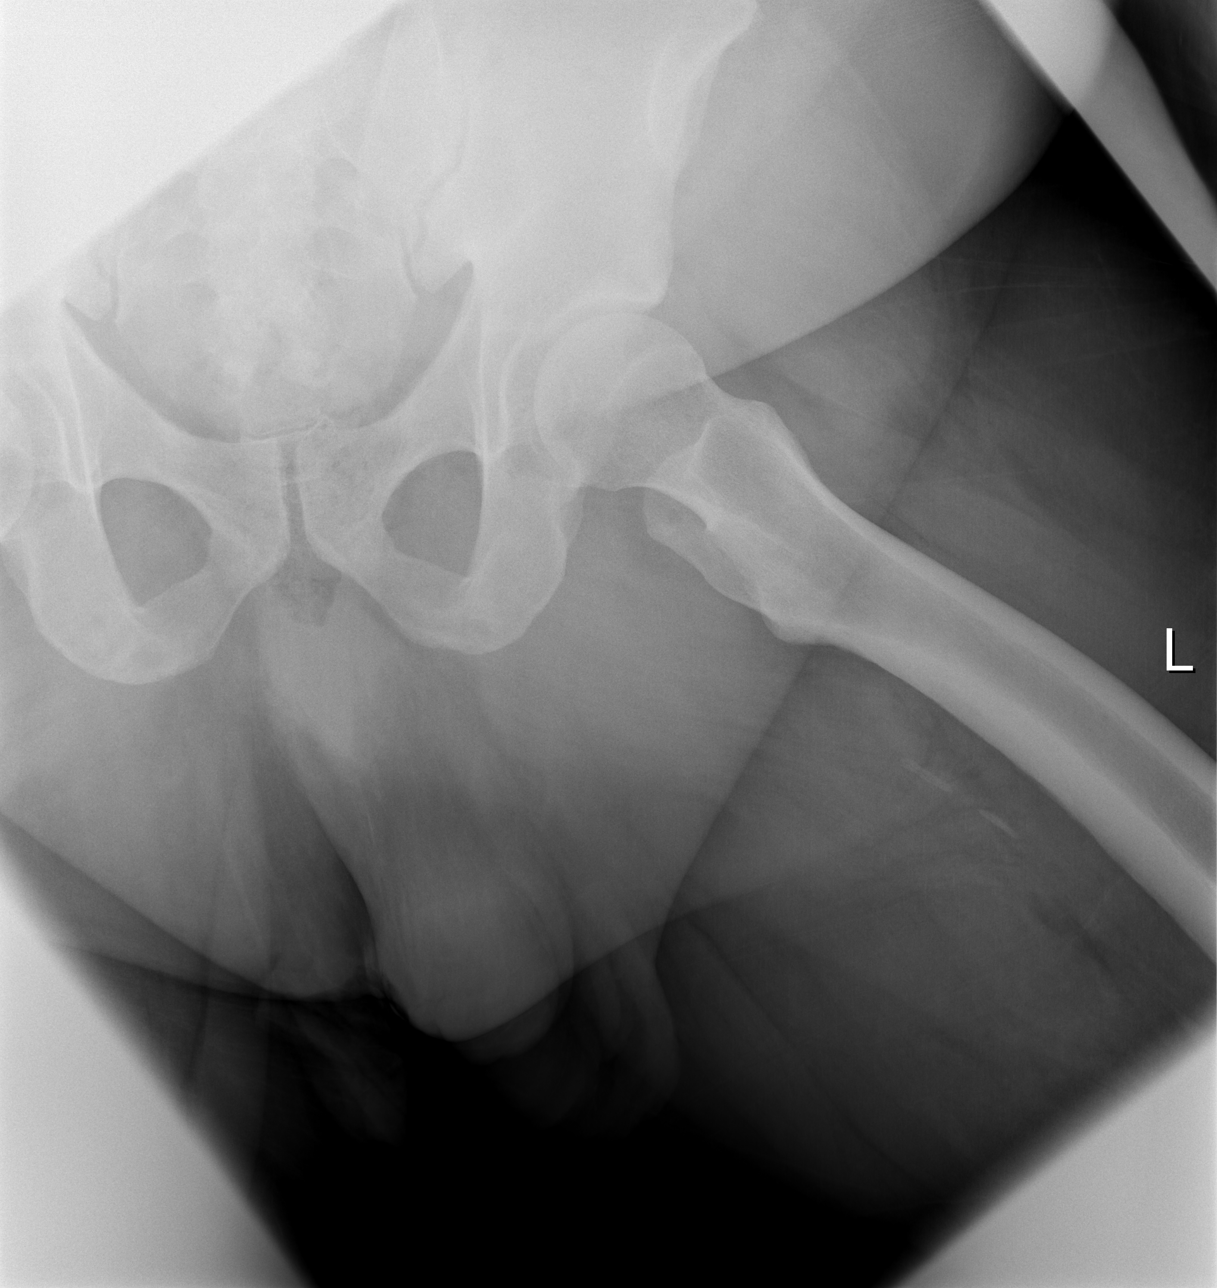

[3 of 3 positions shown; findings below may reference images not displayed]

FINDINGS: Normal bone mineralization. The bilateral sacroiliac, bilateral
femoroacetabular, and pubic symphysis joint spaces are maintained.
Mild chronic enthesopathic change at the abductor tendon insertions
on the greater trochanter and iliopsoas tendon insertion on the
lesser trochanter of the proximal left femur.

No acute fracture or dislocation.
IMPRESSION: No significant left hip osteoarthritis.
